# Patient Record
Sex: Female | Born: 1955 | Race: White | Hispanic: No | Marital: Single | Smoking: Former smoker
Health system: Southern US, Community
[De-identification: ages and names within clinical notes are randomized; demographics above are authoritative.]

## PROBLEM LIST (undated history)

## (undated) DIAGNOSIS — I639 Cerebral infarction, unspecified: Secondary | ICD-10-CM

## (undated) DIAGNOSIS — R51 Headache: Secondary | ICD-10-CM

## (undated) DIAGNOSIS — R0602 Shortness of breath: Secondary | ICD-10-CM

## (undated) DIAGNOSIS — R569 Unspecified convulsions: Secondary | ICD-10-CM

## (undated) DIAGNOSIS — C801 Malignant (primary) neoplasm, unspecified: Secondary | ICD-10-CM

## (undated) DIAGNOSIS — G039 Meningitis, unspecified: Secondary | ICD-10-CM

## (undated) HISTORY — PX: BREAST SURGERY: SHX581

## (undated) HISTORY — PX: CHOLECYSTECTOMY: SHX55

## (undated) HISTORY — PX: ABDOMINAL HYSTERECTOMY: SHX81

---

## 1998-01-01 ENCOUNTER — Ambulatory Visit (HOSPITAL_COMMUNITY): Admission: RE | Admit: 1998-01-01 | Discharge: 1998-01-01 | Payer: Self-pay | Admitting: Obstetrics and Gynecology

## 1998-10-01 ENCOUNTER — Other Ambulatory Visit: Admission: RE | Admit: 1998-10-01 | Discharge: 1998-10-01 | Payer: Self-pay | Admitting: Obstetrics and Gynecology

## 1998-11-05 ENCOUNTER — Inpatient Hospital Stay (HOSPITAL_COMMUNITY): Admission: RE | Admit: 1998-11-05 | Discharge: 1998-11-07 | Payer: Self-pay | Admitting: Obstetrics and Gynecology

## 1998-11-05 ENCOUNTER — Encounter (INDEPENDENT_AMBULATORY_CARE_PROVIDER_SITE_OTHER): Payer: Self-pay

## 1998-11-10 ENCOUNTER — Inpatient Hospital Stay (HOSPITAL_COMMUNITY): Admission: AD | Admit: 1998-11-10 | Discharge: 1998-11-10 | Payer: Self-pay | Admitting: Obstetrics and Gynecology

## 1999-03-21 ENCOUNTER — Encounter: Payer: Self-pay | Admitting: Oncology

## 1999-03-21 ENCOUNTER — Encounter: Admission: RE | Admit: 1999-03-21 | Discharge: 1999-03-21 | Payer: Self-pay | Admitting: Oncology

## 1999-04-04 ENCOUNTER — Ambulatory Visit (HOSPITAL_BASED_OUTPATIENT_CLINIC_OR_DEPARTMENT_OTHER): Admission: RE | Admit: 1999-04-04 | Discharge: 1999-04-04 | Payer: Self-pay | Admitting: General Surgery

## 1999-04-04 ENCOUNTER — Encounter: Payer: Self-pay | Admitting: General Surgery

## 1999-12-29 ENCOUNTER — Other Ambulatory Visit: Admission: RE | Admit: 1999-12-29 | Discharge: 1999-12-29 | Payer: Self-pay | Admitting: Obstetrics and Gynecology

## 2000-03-22 ENCOUNTER — Encounter: Admission: RE | Admit: 2000-03-22 | Discharge: 2000-03-22 | Payer: Self-pay | Admitting: General Surgery

## 2000-03-22 ENCOUNTER — Encounter: Payer: Self-pay | Admitting: General Surgery

## 2000-07-14 ENCOUNTER — Encounter: Payer: Self-pay | Admitting: Oncology

## 2000-07-14 ENCOUNTER — Encounter: Admission: RE | Admit: 2000-07-14 | Discharge: 2000-07-14 | Payer: Self-pay | Admitting: Oncology

## 2000-12-29 ENCOUNTER — Other Ambulatory Visit: Admission: RE | Admit: 2000-12-29 | Discharge: 2000-12-29 | Payer: Self-pay | Admitting: Obstetrics and Gynecology

## 2001-03-23 ENCOUNTER — Encounter: Admission: RE | Admit: 2001-03-23 | Discharge: 2001-03-23 | Payer: Self-pay | Admitting: General Surgery

## 2001-03-23 ENCOUNTER — Encounter: Payer: Self-pay | Admitting: General Surgery

## 2001-10-17 ENCOUNTER — Encounter: Admission: RE | Admit: 2001-10-17 | Discharge: 2001-10-17 | Payer: Self-pay | Admitting: Family Medicine

## 2001-10-17 ENCOUNTER — Encounter: Payer: Self-pay | Admitting: Family Medicine

## 2001-10-19 ENCOUNTER — Encounter: Payer: Self-pay | Admitting: Family Medicine

## 2001-10-19 ENCOUNTER — Ambulatory Visit (HOSPITAL_COMMUNITY): Admission: RE | Admit: 2001-10-19 | Discharge: 2001-10-19 | Payer: Self-pay | Admitting: Family Medicine

## 2001-10-20 ENCOUNTER — Encounter: Payer: Self-pay | Admitting: Family Medicine

## 2001-10-20 ENCOUNTER — Encounter: Admission: RE | Admit: 2001-10-20 | Discharge: 2001-10-20 | Payer: Self-pay | Admitting: Family Medicine

## 2001-11-09 ENCOUNTER — Ambulatory Visit (HOSPITAL_COMMUNITY): Admission: RE | Admit: 2001-11-09 | Discharge: 2001-11-10 | Payer: Self-pay | Admitting: *Deleted

## 2001-11-09 ENCOUNTER — Encounter (INDEPENDENT_AMBULATORY_CARE_PROVIDER_SITE_OTHER): Payer: Self-pay | Admitting: Specialist

## 2001-11-09 ENCOUNTER — Encounter: Payer: Self-pay | Admitting: *Deleted

## 2002-03-24 ENCOUNTER — Encounter: Payer: Self-pay | Admitting: General Surgery

## 2002-03-24 ENCOUNTER — Encounter: Admission: RE | Admit: 2002-03-24 | Discharge: 2002-03-24 | Payer: Self-pay | Admitting: General Surgery

## 2002-04-26 ENCOUNTER — Encounter: Payer: Self-pay | Admitting: Family Medicine

## 2002-04-26 ENCOUNTER — Encounter: Admission: RE | Admit: 2002-04-26 | Discharge: 2002-04-26 | Payer: Self-pay | Admitting: Family Medicine

## 2002-05-15 ENCOUNTER — Encounter: Admission: RE | Admit: 2002-05-15 | Discharge: 2002-06-15 | Payer: Self-pay | Admitting: Orthopedic Surgery

## 2003-04-19 ENCOUNTER — Encounter: Admission: RE | Admit: 2003-04-19 | Discharge: 2003-04-19 | Payer: Self-pay | Admitting: General Surgery

## 2004-12-04 ENCOUNTER — Ambulatory Visit (HOSPITAL_COMMUNITY): Admission: RE | Admit: 2004-12-04 | Discharge: 2004-12-04 | Payer: Self-pay | Admitting: Surgery

## 2004-12-04 ENCOUNTER — Encounter (INDEPENDENT_AMBULATORY_CARE_PROVIDER_SITE_OTHER): Payer: Self-pay | Admitting: Specialist

## 2006-04-15 ENCOUNTER — Encounter: Admission: RE | Admit: 2006-04-15 | Discharge: 2006-04-15 | Payer: Self-pay | Admitting: Family Medicine

## 2007-11-24 ENCOUNTER — Encounter: Admission: RE | Admit: 2007-11-24 | Discharge: 2007-11-24 | Payer: Self-pay | Admitting: Family Medicine

## 2008-11-26 ENCOUNTER — Encounter: Admission: RE | Admit: 2008-11-26 | Discharge: 2008-11-26 | Payer: Self-pay | Admitting: Family Medicine

## 2009-06-03 ENCOUNTER — Encounter: Admission: RE | Admit: 2009-06-03 | Discharge: 2009-06-03 | Payer: Self-pay | Admitting: Family Medicine

## 2009-11-27 ENCOUNTER — Encounter: Admission: RE | Admit: 2009-11-27 | Discharge: 2009-11-27 | Payer: Self-pay | Admitting: Family Medicine

## 2010-08-22 NOTE — Op Note (Signed)
[facility]  Patient:    Sheila Patterson                      MRN: [id number] Proc. Date: 04/04/99 Adm. Date:  [id number] Attending:  Janalyn Rouse                           Operative Report  PREOPERATIVE DIAGNOSIS:  Abnormal left breast mammogram with microcalcifications.  POSTOPERATIVE DIAGNOSIS:  Abnormal left breast mammogram with microcalcifications.  OPERATION:  Left breast biopsy with needle localization and specimen mammography.  SURGEON:  Rose Phi. Maple Hudson, M.D.  ANESTHESIA:  MAC.  OPERATIVE PROCEDURE:  The patient was placed on the operating table, and the left breast was prepped and draped in the usual fashion.  A curvilinear incision that was actually circumareolar in nature, was made around the previously placed wire centered at about the 12 oclock position of the left breast.  The area was then  thoroughly infiltrated with 1% Xylocaine with adrenaline.  An incision was made  with sharp dissection, and this area was excised.  Hemostasis was obtained with  electrocautery.  Specimen mammography confirmed the removal of the microcalcifications.  A subcuticular closure of 4-0 Monocryl and Steri-Strips was carried out. Dressing applied.  The patient was transferred to the recovery room in satisfactory condition having tolerated the procedure well. DD:  04/04/99 TD:  04/05/99 Job: [id number]

## 2010-08-22 NOTE — Op Note (Signed)
NAME:  Sheila Patterson, Sheila Patterson              ACCOUNT NO.:  [id number]   MEDICAL RECORD NO.:  [id number]          PATIENT TYPE:  AMB   LOCATION:  SDS                          FACILITY:  [facility]   PHYSICIAN:  Thomas A. 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   SURGEON:  Maisie Fus A. Cornett, M.D.   ANESTHESIA:  MAC with 15 cc of 0.5% Sensorcaine local.   ESTIMATED BLOOD LOSS:  5 cc.   SPECIMENS:  Left arm skin lesion to pathology.   INDICATIONS FOR PROCEDURE:  The patient is a 55 year old female who had a  pigmented lesion biopsied from her left forearm.  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  This could not be delineated from her biopsy.  I recommended a wide local  excision as well as a sentinel lymph node mapping in this setting, since the  pathology was somewhat vague, and the lesion was fairly small.  After  discussion with the patient, she just wanted to have this excised initially  and if a sentinel lymph node biopsy needed to be done, it could be done at a  later time.  I explained that this was an option, but that after initial  biopsy, can be a little more difficult to do sentinel lymph node mapping,  but she was fine with this after explaining it to her.   DESCRIPTION OF PROCEDURE:  Patient was brought to the operating suite and  placed supine.  After MAC anesthesia, left forearm was prepped and draped in  a sterile fashion.  The lesion looked like it measured roughly 0.6 x 0.5 cm  from her previous biopsy site.  I marked a 1 cm margin around this using a  ruler from the edge of the lesion circumferentially.  I then  excised the  outline of this with a scalpel.  Cautery was used to dissect all the way  down to the fascia of the flexor muscles of her left forearm.  Hemostasis  was excellent after removal of the lesion.  I closed the lesion in two  layers with a 3-0 Vicryl and subsequent 4-0 Monocryl subcuticular stitch.  Steri-Strips and dry dressings were applied.  All final counts of sponge,  needle, and instruments were counted and found to be correct in this portion  of the case.  The patient was awakened and taken to the recovery room in  satisfactory condition.      Thomas A. Cornett, M.D.  Electronically Signed     TAC/MEDQ  D:  12/04/2004  T:  12/04/2004  Job:  [id number]

## 2010-08-22 NOTE — Op Note (Signed)
NAME:  Sheila Patterson, Sheila Patterson                        ACCOUNT NO.:  [id number]   MEDICAL RECORD NO.:  [id number]                   PATIENT TYPE:  OIB   LOCATION:  5742                                 FACILITY:  [facility]   PHYSICIAN:  Maisie Fus B. Samuella Cota, M.D.               DATE OF BIRTH:  1955-07-07   DATE OF PROCEDURE:  11/09/2001  DATE OF DISCHARGE:  11/10/2001                                 OPERATIVE REPORT   CCS#:  [id number]   PREOPERATIVE DIAGNOSIS:  Chronic cholecystitis with cholelithiasis.   POSTOPERATIVE DIAGNOSIS:  Chronic cholecystitis with cholelithiasis.   OPERATION/PROCEDURE:  Laparoscopic cholecystectomy with operative  cholangiogram.   SURGEON:  Dr. Rozetta Nunnery.   ASSISTANT:  Gabrielle Dare. Janee Morn, MD   ANESTHESIA:  General.   ANESTHESIOLOGIST:  Kaylyn Layer. Michelle Piper, M.D. and CRNA.   DESCRIPTION OF PROCEDURE:  The patient was taken to the operating room,  placed on the table in supine position and after satisfactory general  anesthetic with intubation, the entire abdomen was prepped and draped in a  sterile field. The patient had had a previous TRAM operation with  reconstruction of the right breast.  A small vertical infraumbilical  incision was made through the skin and subcutaneous tissue and with gentle  dissection, the peritoneal cavity was entered. No mesh was ever encountered.  Pursestring suture of #0 Vicryl was placed and the Hasson trocar placed into  the abdomen and the abdomen insufflated to 14 mmHg pressure. A second 10 mm  trocar was placed just to the right of midline in the subxiphoid area. Two 5  mm trocars were placed quite laterally to avoid the rectus muscle. The  gallbladder was placed on traction and there were some adhesions to the  gallbladder which extended about halfway up the gallbladder. The adhesions  were taken down with gentle dissection. A small anterior cystic artery was  identified, doubly clipped and divided. The cystic duct was then identified.  The posterior cystic artery was identified which was large. This was triply  clipped on the remaining side, once on the gallbladder side and divided. An  Endoclip was placed on the gallbladder side of the cystic duct. A Cook  cholangiocath was then inserted through a separate stab wound in the right  upper quadrant of the abdomen. A small opening was made into cystic duct and  the cholangiocath was placed into the cystic duct and held with an Endoclip.  Using real-time C-arm fluoroscopy, cholangiogram was carried out. This  showed good filling in the entire biliary system with spillage of contrast  into the duodenum. There was no evidence of any filling defects. The cystic  duct was quite long. Following the cholangiogram, the cholangiocath was  removed and the cystic duct was dissected somewhat further so that three  clips could easily be placed across the remaining side and then the cystic  duct was divided. The gallbladder was dissected from  the bed using the  cautery. Another vessel high in the bed was doubly clipped and divided. The  gallbladder was finally dissected free, hemostasis was obtained. The  gallbladder was easily removed through the infraumbilical incision. She had  several very small stones in the gallbladder. A pursestring suture at the  infraumbilical incision was tied to give good closure. The right upper  quadrant was suctioned and irrigated with a full liter of fluid. There was  no evidence of any bleeding or bile leak. The two lateral trocars were  removed under direct vision. The two midline incisions were closed with  running subcuticular 4-0 Vicryl and the two lateral trocar  sites were closed with single simple inverted 4-0 Vicryl. Benzoin and 1/2  inch Steri-Strips were used to reinforce the skin closures. Dry sterile  dressings were applied. The patient seemed to tolerate the procedure well  and was taken to the PACU in satisfactory condition.                                                    Thomas B. Samuella Cota, M.D.    TBP/MEDQ  D:  11/09/2001  T:  11/13/2001  Job:  [id number]   cc:   Desma Maxim, M.D.

## 2010-10-20 ENCOUNTER — Other Ambulatory Visit: Payer: Self-pay | Admitting: Family Medicine

## 2010-10-20 DIAGNOSIS — Z1231 Encounter for screening mammogram for malignant neoplasm of breast: Secondary | ICD-10-CM

## 2010-12-02 ENCOUNTER — Ambulatory Visit
Admission: RE | Admit: 2010-12-02 | Discharge: 2010-12-02 | Disposition: A | Discharge status: Home / Self Care | Payer: Medicare Other | Source: Ambulatory Visit | Attending: Family Medicine | Admitting: Family Medicine

## 2010-12-02 DIAGNOSIS — Z1231 Encounter for screening mammogram for malignant neoplasm of breast: Secondary | ICD-10-CM

## 2011-06-01 DIAGNOSIS — L299 Pruritus, unspecified: Secondary | ICD-10-CM | POA: Diagnosis not present

## 2011-06-24 ENCOUNTER — Other Ambulatory Visit: Payer: Self-pay | Admitting: Family Medicine

## 2011-06-24 DIAGNOSIS — Z1231 Encounter for screening mammogram for malignant neoplasm of breast: Secondary | ICD-10-CM

## 2011-07-21 DIAGNOSIS — L0292 Furuncle, unspecified: Secondary | ICD-10-CM | POA: Diagnosis not present

## 2011-07-21 DIAGNOSIS — L0293 Carbuncle, unspecified: Secondary | ICD-10-CM | POA: Diagnosis not present

## 2011-07-21 DIAGNOSIS — L578 Other skin changes due to chronic exposure to nonionizing radiation: Secondary | ICD-10-CM | POA: Diagnosis not present

## 2011-08-07 DIAGNOSIS — R05 Cough: Secondary | ICD-10-CM | POA: Diagnosis not present

## 2011-08-25 ENCOUNTER — Encounter (HOSPITAL_COMMUNITY): Payer: Self-pay | Admitting: *Deleted

## 2011-08-25 ENCOUNTER — Inpatient Hospital Stay (HOSPITAL_COMMUNITY)
Admission: EM | Admit: 2011-08-25 | Discharge: 2011-09-03 | DRG: 066 | Disposition: A | Discharge status: Home Health | Payer: Medicare Other | Source: Other Acute Inpatient Hospital | Attending: Neurosurgery | Admitting: Neurosurgery

## 2011-08-25 ENCOUNTER — Inpatient Hospital Stay (HOSPITAL_COMMUNITY): Payer: Medicare Other

## 2011-08-25 DIAGNOSIS — Z8673 Personal history of transient ischemic attack (TIA), and cerebral infarction without residual deficits: Secondary | ICD-10-CM

## 2011-08-25 DIAGNOSIS — R51 Headache: Secondary | ICD-10-CM | POA: Diagnosis not present

## 2011-08-25 DIAGNOSIS — S066X9A Traumatic subarachnoid hemorrhage with loss of consciousness of unspecified duration, initial encounter: Secondary | ICD-10-CM | POA: Diagnosis not present

## 2011-08-25 DIAGNOSIS — Z87891 Personal history of nicotine dependence: Secondary | ICD-10-CM

## 2011-08-25 DIAGNOSIS — I69998 Other sequelae following unspecified cerebrovascular disease: Secondary | ICD-10-CM | POA: Diagnosis not present

## 2011-08-25 DIAGNOSIS — Z853 Personal history of malignant neoplasm of breast: Secondary | ICD-10-CM | POA: Diagnosis not present

## 2011-08-25 DIAGNOSIS — I609 Nontraumatic subarachnoid hemorrhage, unspecified: Principal | ICD-10-CM | POA: Diagnosis present

## 2011-08-25 DIAGNOSIS — I6529 Occlusion and stenosis of unspecified carotid artery: Secondary | ICD-10-CM | POA: Diagnosis not present

## 2011-08-25 DIAGNOSIS — G911 Obstructive hydrocephalus: Secondary | ICD-10-CM | POA: Diagnosis not present

## 2011-08-25 HISTORY — DX: Meningitis, unspecified: G03.9

## 2011-08-25 HISTORY — DX: Headache: R51

## 2011-08-25 HISTORY — DX: Unspecified convulsions: R56.9

## 2011-08-25 HISTORY — DX: Shortness of breath: R06.02

## 2011-08-25 HISTORY — DX: Malignant (primary) neoplasm, unspecified: C80.1

## 2011-08-25 HISTORY — DX: Cerebral infarction, unspecified: I63.9

## 2011-08-25 LAB — CBC
HCT: 45 % (ref 36.0–46.0)
MCHC: 33.1 g/dL (ref 30.0–36.0)
MCV: 88.9 fL (ref 78.0–100.0)
Platelets: 293 10*3/uL (ref 150–400)
RDW: 13.1 % (ref 11.5–15.5)
WBC: 9.6 10*3/uL (ref 4.0–10.5)

## 2011-08-25 LAB — URINALYSIS, ROUTINE W REFLEX MICROSCOPIC
Ketones, ur: 40 mg/dL — AB
Leukocytes, UA: NEGATIVE
Nitrite: NEGATIVE
Protein, ur: 100 mg/dL — AB
Urobilinogen, UA: 0.2 mg/dL (ref 0.0–1.0)

## 2011-08-25 LAB — PROTIME-INR
INR: 0.97 (ref 0.00–1.49)
Prothrombin Time: 13.1 seconds (ref 11.6–15.2)

## 2011-08-25 LAB — MRSA PCR SCREENING: MRSA by PCR: NEGATIVE

## 2011-08-25 LAB — COMPREHENSIVE METABOLIC PANEL
AST: 28 U/L (ref 0–37)
Albumin: 4.1 g/dL (ref 3.5–5.2)
BUN: 12 mg/dL (ref 6–23)
Chloride: 99 mEq/L (ref 96–112)
Creatinine, Ser: 0.44 mg/dL — ABNORMAL LOW (ref 0.50–1.10)
Potassium: 4.3 mEq/L (ref 3.5–5.1)
Total Bilirubin: 1 mg/dL (ref 0.3–1.2)
Total Protein: 7.6 g/dL (ref 6.0–8.3)

## 2011-08-25 LAB — URINE MICROSCOPIC-ADD ON

## 2011-08-25 MED ORDER — MORPHINE SULFATE 2 MG/ML IJ SOLN
1.0000 mg | INTRAMUSCULAR | Status: DC | PRN
  Administered 2011-08-25: 4 mg via INTRAVENOUS
  Administered 2011-08-25: 2 mg via INTRAVENOUS
  Administered 2011-08-26: 4 mg via INTRAVENOUS
  Filled 2011-08-25 (×2): qty 2
  Filled 2011-08-25: qty 1

## 2011-08-25 MED ORDER — SENNOSIDES-DOCUSATE SODIUM 8.6-50 MG PO TABS
1.0000 | ORAL_TABLET | Freq: Two times a day (BID) | ORAL | Status: DC
  Administered 2011-08-25 – 2011-09-02 (×13): 1 via ORAL
  Filled 2011-08-25 (×15): qty 1

## 2011-08-25 MED ORDER — ACETAMINOPHEN 325 MG PO TABS
650.0000 mg | ORAL_TABLET | ORAL | Status: DC | PRN
  Administered 2011-08-27 – 2011-09-03 (×6): 650 mg via ORAL
  Filled 2011-08-25 (×5): qty 2

## 2011-08-25 MED ORDER — SODIUM CHLORIDE 0.9 % IV SOLN
INTRAVENOUS | Status: DC
  Administered 2011-08-26 – 2011-08-30 (×6): via INTRAVENOUS

## 2011-08-25 MED ORDER — NIMODIPINE 30 MG/ML ORAL SOLUTION
60.0000 mg | ORAL | Status: DC
  Administered 2011-08-25 – 2011-08-27 (×5): 60 mg
  Filled 2011-08-25 (×53): qty 2

## 2011-08-25 MED ORDER — NIMODIPINE 30 MG PO CAPS
60.0000 mg | ORAL_CAPSULE | ORAL | Status: DC
  Administered 2011-08-25 – 2011-09-03 (×47): 60 mg via ORAL
  Filled 2011-08-25 (×61): qty 2

## 2011-08-25 MED ORDER — ACETAMINOPHEN 650 MG RE SUPP: 650.0000 mg | RECTAL | Status: DC | PRN

## 2011-08-25 MED ORDER — LORAZEPAM 2 MG/ML IJ SOLN
INTRAMUSCULAR | Status: AC
  Filled 2011-08-25: qty 1

## 2011-08-25 MED ORDER — LORAZEPAM 2 MG/ML IJ SOLN
1.0000 mg | Freq: Once | INTRAMUSCULAR | Status: AC
  Administered 2011-08-25: 1 mg via INTRAVENOUS

## 2011-08-25 MED ORDER — ACETAMINOPHEN-CODEINE #3 300-30 MG PO TABS
1.0000 | ORAL_TABLET | ORAL | Status: DC | PRN
  Administered 2011-08-26 – 2011-08-27 (×5): 2 via ORAL
  Administered 2011-08-27: 1 via ORAL
  Administered 2011-08-27 – 2011-08-28 (×6): 2 via ORAL
  Administered 2011-08-29: 1 via ORAL
  Administered 2011-08-29: 2 via ORAL
  Administered 2011-08-29: 1 via ORAL
  Filled 2011-08-25: qty 1
  Filled 2011-08-25 (×5): qty 2
  Filled 2011-08-25: qty 1
  Filled 2011-08-25 (×3): qty 2
  Filled 2011-08-25: qty 1
  Filled 2011-08-25 (×6): qty 2

## 2011-08-25 MED ORDER — PANTOPRAZOLE SODIUM 40 MG IV SOLR
40.0000 mg | Freq: Every day | INTRAVENOUS | Status: DC
  Administered 2011-08-25 – 2011-08-26 (×2): 40 mg via INTRAVENOUS
  Filled 2011-08-25 (×3): qty 40

## 2011-08-25 MED ORDER — ONDANSETRON HCL 4 MG/2ML IJ SOLN
4.0000 mg | Freq: Four times a day (QID) | INTRAMUSCULAR | Status: DC | PRN
  Administered 2011-08-26: 4 mg via INTRAVENOUS
  Filled 2011-08-25: qty 2

## 2011-08-25 MED ORDER — IOHEXOL 350 MG/ML SOLN
50.0000 mL | Freq: Once | INTRAVENOUS | Status: AC | PRN
  Administered 2011-08-25: 50 mL via INTRAVENOUS

## 2011-08-25 MED ORDER — LABETALOL HCL 5 MG/ML IV SOLN: 10.0000 mg | INTRAVENOUS | Status: DC | PRN
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
## 2011-08-26 NOTE — Progress Notes (Signed)
PT/OT Cancellation Note  Treatment cancelled today due to medical issues with patient which prohibited therapy.  Pt currently on bedrest.  Please update activity orders when appropriate.    Sheila Patterson 08/26/2011, 1:13 PM Jake Shark, PT DPT [phone]

## 2011-08-26 NOTE — Progress Notes (Signed)
Chaplain Note:  Chaplain visited pt and family.  Pt was in bed, asleep, and did not awaken during this visit.  Pt's mother and aunt were at bedside.  Chaplain's family discussed their concern over pt's condition and requested prayer.  Chaplain provided spiritual comfort, support, and prayer for pt and family.  Family expressed appreciation for chaplain support.  Chaplain will follow up as needed.   08/26/11 1300  Clinical Encounter Type  Visited With Patient and family together  Visit Type Initial;Spiritual support  Referral From Other (Comment) (Shandi Godfrey-referral)  Spiritual Encounters  Spiritual Needs Emotional;Prayer  Stress Factors  Patient Stress Factors Health changes;Lack of knowledge;Loss of control  Family Stress Factors Loss of control;Lack of knowledge    Verdie Shire, chaplain resident [phone]

## 2011-08-26 NOTE — Progress Notes (Signed)
08/26/2011 Cipriano Mile OTR/L Pager [phone] Office [phone]

## 2011-08-26 NOTE — Progress Notes (Signed)
Patient ID: Sheila Patterson, female   DOB: [birth year], 56 y.o.   MRN: [id number] BP 101/44  Pulse 60  Temp(Src) 99.3 F (37.4 C) (Oral)  Resp 22  Ht 5\' 5"  (1.651 m)  Wt 67.2 kg (148 lb 2.4 oz)  BMI 24.65 kg/m2  SpO2 94% Alert and oriented x4, speech clear and fluent. Following commands Perrl, full eom Symmetric facies.  Doing much better today. Continue hydration,watch in unit.
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
## 2011-08-27 MED ORDER — OXYCODONE HCL 5 MG PO TABS
5.0000 mg | ORAL_TABLET | ORAL | Status: DC | PRN
  Administered 2011-08-28 (×4): 5 mg via ORAL
  Administered 2011-08-29: 10 mg via ORAL
  Administered 2011-08-29 – 2011-08-30 (×4): 5 mg via ORAL
  Administered 2011-08-30 (×4): 10 mg via ORAL
  Administered 2011-08-31 (×2): 5 mg via ORAL
  Administered 2011-08-31 – 2011-09-01 (×3): 10 mg via ORAL
  Administered 2011-09-01: 5 mg via ORAL
  Filled 2011-08-27 (×3): qty 1
  Filled 2011-08-27 (×3): qty 2
  Filled 2011-08-27 (×5): qty 1
  Filled 2011-08-27 (×5): qty 2
  Filled 2011-08-27 (×3): qty 1

## 2011-08-27 MED ORDER — PANTOPRAZOLE SODIUM 40 MG PO TBEC
40.0000 mg | DELAYED_RELEASE_TABLET | Freq: Every day | ORAL | Status: DC
  Administered 2011-08-27 – 2011-09-03 (×8): 40 mg via ORAL
  Filled 2011-08-27 (×7): qty 1

## 2011-08-27 NOTE — Progress Notes (Signed)
Patient is receiving Protonix by the IV route.  Pt meets the P & T approved criteria for changing to oral administration.  - No GI bleeding  - Tolerating an oral or per tube diet  - Taking other oral or per tube medications.  Will change patient to Protonix 40mg PO daily per P&T policy.  Thank you. Briella Hobday, Pharm.D., BCPS Clinical Pharmacist Pager [phone]    

## 2011-08-27 NOTE — Progress Notes (Signed)
PT/OT Cancellation Note  Treatment cancelled today due to patient's refusal to participate.   Pt very fatigue and have difficulty keeping eyes open.  Pt continues to c/o headache and wishes defer PT/OT evaluation until tomorrow.  Jerzy Roepke 08/27/2011, 3:58 PM Jake Shark, PT DPT [phone]

## 2011-08-27 NOTE — Progress Notes (Signed)
PT/OT Cancellation Note  Treatment cancelled today due to patient's refusal to participate. Pt very fatigue and have difficulty keeping eyes open. Pt continues to c/o headache and wishes defer PT/OT evaluation until tomorrow.  08/27/2011 Cipriano Mile OTR/L Pager [phone] Office [phone]

## 2011-08-27 NOTE — Progress Notes (Signed)
Patient ID: Sheila Patterson, female   DOB: 1955-12-08, 56 y.o.   MRN: [id number] BP 143/62  Pulse 53  Temp(Src) 98.8 F (37.1 C) (Oral)  Resp 17  Ht 5\' 5"  (1.651 m)  Wt 67.1 kg (147 lb 14.9 oz)  BMI 24.62 kg/m2  SpO2 96% Alert and oriented x 4. Speech, clear and fluent  Perrl, full eom Symmetric facies, symmetric facial sensation Tongue and uvula midline Moving all extremities. Looks better, cognition improved. Doing quite well.

## 2011-08-27 NOTE — Progress Notes (Signed)
Chaplain Note:  Chaplain had follow up visit with pt and family.  Pt was lying in bed, awake.  Pt's mother was seated at bedside.  Pt expressed head pain and needed to sleep. Chaplain provided spiritual comfort, support, and prayer for pt and pt's mother.  Both expressed appreciation for chaplain support.  08/27/11 1500  Clinical Encounter Type  Visited With Patient and family together  Visit Type Follow-up;Spiritual support  Referral From Other (Comment) (Naavya Postma referral)  Spiritual Encounters  Spiritual Needs Emotional;Prayer  Stress Factors  Patient Stress Factors Health changes  Family Stress Factors Loss of control   Verdie Shire, chaplain resident [phone]

## 2011-08-28 NOTE — Progress Notes (Signed)
Patient ID: Sheila Patterson, female   DOB: 1955/05/15, 56 y.o.   MRN: [id number] BP 103/60  Pulse 89  Temp(Src) 98.9 F (37.2 C) (Oral)  Resp 16  Ht 5\' 5"  (1.651 m)  Wt 67.1 kg (147 lb 14.9 oz)  BMI 24.62 kg/m2  SpO2 96% Alert and oriented x 4. Speech clear, fluent. Perrl, full eom Symmetric facies. Tongue and uvula midline Moving all extremities well. No drift May be able to move to the floor .

## 2011-08-28 NOTE — Progress Notes (Signed)
08/28/2011 Cipriano Mile OTR/L Pager [phone] Office [phone]

## 2011-08-28 NOTE — Progress Notes (Signed)
PT/OT Cancellation Note  Treatment cancelled today due to patient's refusal to participate. Spoke with pt regarding importance of getting up, although pt states that she does not feel well. Will attempt evaluation this afternoon  08/28/2011 Milana Kidney DPT PAGER: [phone] OFFICE: [phone]    Milana Kidney 08/28/2011, 9:10 AM

## 2011-08-28 NOTE — Evaluation (Signed)
Physical Therapy Evaluation Patient Details Name: Sheila Patterson MRN: [id number] DOB: 1955/12/12 Today's Date: 08/28/2011 Time: 7829-5621 PT Time Calculation (min): 18 min  PT Assessment / Plan / Recommendation Clinical Impression  Pt presents with a medical diagnosis of SAH. Difficult to assess pt's cognitive level to determine if pt is at baseline. Pt has decreased safety awareness and impulsivity requiring supervision to min assist for all mobility. Pt will benefit from skilled PT in the acute care setting in order to maximize functional mobility and safety prior to d/c    PT Assessment  Patient needs continued PT services    Follow Up Recommendations  Other (comment) (TBD)    Barriers to Discharge Decreased caregiver support      lEquipment Recommendations  Other (comment) (TBD)    Recommendations for Other Services     Frequency Min 3X/week    Precautions / Restrictions Precautions Precautions: Fall Restrictions Weight Bearing Restrictions: No         Mobility  Bed Mobility Bed Mobility: Sit to Supine Sit to Supine: 5: Supervision Details for Bed Mobility Assistance: VC for proper sequencing Transfers Transfers: Stand to Sit;Sit to Stand Sit to Stand: 4: Min assist;With upper extremity assist;From chair/3-in-1 Stand to Sit: 4: Min assist;With upper extremity assist;To bed Details for Transfer Assistance: VC for hand placement and safety due to pt with impulsivity Ambulation/Gait Ambulation/Gait Assistance: 4: Min assist Ambulation Distance (Feet): 30 Feet (distance limited by fatigue and malaise) Assistive device: 1 person hand held assist Ambulation/Gait Assistance Details: VC for safety throughout ambulation. Pt preferred to ambulate holding onto IV pole rather than therapist's hand. Pt impulsive during gait Gait Pattern: Step-to pattern;Narrow base of support Gait velocity: decreased gait speed    Exercises     PT Diagnosis: Difficulty walking;Acute pain   PT Problem List: Decreased balance;Decreased activity tolerance;Decreased mobility;Decreased knowledge of use of DME;Decreased safety awareness PT Treatment Interventions: DME instruction;Gait training;Functional mobility training;Stair training;Therapeutic activities;Therapeutic exercise;Balance training;Neuromuscular re-education;Patient/family education   PT Goals Acute Rehab PT Goals PT Goal Formulation: With patient Time For Goal Achievement: 09/11/11 Potential to Achieve Goals: Good Pt will go Supine/Side to Sit: with modified independence PT Goal: Supine/Side to Sit - Progress: Goal set today Pt will go Sit to Supine/Side: with modified independence PT Goal: Sit to Supine/Side - Progress: Goal set today Pt will go Sit to Stand: with modified independence PT Goal: Sit to Stand - Progress: Goal set today Pt will go Stand to Sit: with modified independence PT Goal: Stand to Sit - Progress: Goal set today Pt will Transfer Bed to Chair/Chair to Bed: with modified independence PT Transfer Goal: Bed to Chair/Chair to Bed - Progress: Goal set today Pt will Ambulate: >150 feet;with modified independence;with least restrictive assistive device PT Goal: Ambulate - Progress: Goal set today Pt will Go Up / Down Stairs: Flight;with supervision;with rail(s) PT Goal: Up/Down Stairs - Progress: Goal set today  Visit Information  Last PT Received On: 08/28/11 PT/OT Co-Evaluation/Treatment: Yes    Subjective Data      Prior Functioning  Home Living Lives With: Alone Available Help at Discharge: Family;Available 24 hours/day Type of Home: House Home Access: Stairs to enter [employer] of Steps: 12 Entrance Stairs-Rails: Right Home Layout: One level Bathroom Shower/Tub: Forensic scientist: Standard Bathroom Accessibility: Yes How Accessible: Accessible via walker Home Adaptive Equipment: None Prior Function Level of Independence: Independent Able to  Take Stairs?: Yes Driving: Yes Vocation: On disability Communication Communication: No difficulties Dominant Hand: Right  Cognition  Overall Cognitive Status: Impaired Area of Impairment: Safety/judgement;Attention Orientation Level: Appears intact for tasks assessed Behavior During Session: Restless Safety/Judgement: Decreased safety judgement for tasks assessed;Impulsive;Decreased awareness of need for assistance    Extremity/Trunk Assessment Right Lower Extremity Assessment RLE ROM/Strength/Tone: Within functional levels Left Lower Extremity Assessment LLE ROM/Strength/Tone: Within functional levels   Balance Balance Balance Assessed: Yes Static Standing Balance Static Standing - Balance Support: Bilateral upper extremity supported;During functional activity Static Standing - Level of Assistance: 5: Stand by assistance Static Standing - Comment/# of Minutes: pt stood at sink for ~10 minutes with stand by assist for safety  End of Session PT - End of Session Equipment Utilized During Treatment: Gait belt Activity Tolerance: Patient limited by fatigue Patient left: in bed;with call bell/phone within reach Nurse Communication: Mobility status   Milana Kidney 08/28/2011, 11:18 AM  08/28/2011 Milana Kidney DPT PAGER: [phone] OFFICE: [phone]

## 2011-08-28 NOTE — Plan of Care (Signed)
Problem: Phase II Progression Outcomes Goal: Tolerating diet/TF at goal rate Outcome: Completed/Met Date Met:  08/28/11 Pt on regular diet, tolerating well

## 2011-08-28 NOTE — Progress Notes (Signed)
Occupational Therapy Evaluation Patient Details Name: Sheila Patterson MRN: [id number] DOB: 07/03/1955 Today's Date: 08/28/2011 Time: 1478-2956 OT Time Calculation (min): 26 min  OT Assessment / Plan / Recommendation Clinical Impression  Pt admitted with [facility] and demonstrates with decreased safety awareness(question if this is baseline).  Will benefit from acute OT services in prep for safe d/c home.    OT Assessment  Patient needs continued OT Services    Follow Up Recommendations  Supervision/Assistance - 24 hour;Home health OT (HHOT vs no OT f/u pending progress)    Barriers to Discharge Decreased caregiver support [name] can assist but unsure how much physical assist she can provide.  Equipment Recommendations  Other (comment) (TBD)    Recommendations for Other Services    Frequency  Min 2X/week    Precautions / Restrictions Precautions Precautions: Fall Restrictions Weight Bearing Restrictions: No   Pertinent Vitals/Pain 5/10 head pain.    ADL  Grooming: Performed;Denture care;Wash/dry face;Set up Where Assessed - Grooming: Unsupported standing Upper Body Dressing: Performed;Set up Where Assessed - Upper Body Dressing: Unsupported sitting Lower Body Dressing: Performed;Set up Where Assessed - Lower Body Dressing: Unsupported sitting Toilet Transfer: Simulated;Minimal assistance Toilet Transfer Method:  (ambulating) Toilet Transfer Equipment:  (bed) Equipment Used: Gait belt Transfers/Ambulation Related to ADLs: Pt ambulated ~30 ft with min hand held assist for steadying and balance.  Pt fatigued quickly during ambulation.    OT Diagnosis: Generalized weakness;Cognitive deficits;Acute pain  OT Problem List: Decreased activity tolerance;Impaired balance (sitting and/or standing);Decreased safety awareness;Decreased knowledge of use of DME or AE;Pain OT Treatment Interventions: Self-care/ADL training;DME and/or AE instruction;Therapeutic  activities;Cognitive remediation/compensation;Patient/family education;Balance training   OT Goals Acute Rehab OT Goals OT Goal Formulation: With patient Time For Goal Achievement: 09/11/11 Potential to Achieve Goals: Good ADL Goals Pt Will Transfer to Toilet: Ambulation;Regular height toilet;with modified independence ADL Goal: Toilet Transfer - Progress: Goal set today Pt Will Perform Tub/Shower Transfer: Tub transfer;with modified independence;Ambulation ADL Goal: Tub/Shower Transfer - Progress: Goal set today Miscellaneous OT Goals Miscellaneous OT Goal #1: Pt will retrieve ADL items with mod I in prep for ADL tasks. OT Goal: Miscellaneous Goal #1 - Progress: Goal set today Miscellaneous OT Goal #2: Pt will independently demonstrate safe hand placement and good safety awareness during all functional transfers. OT Goal: Miscellaneous Goal #2 - Progress: Goal set today  Visit Information  Last OT Received On: 08/28/11    Subjective Data      Prior Functioning  Home Living Lives With: Alone Available Help at Discharge: Family;Available 24 hours/day Type of Home: House Home Access: Stairs to enter [employer] of Steps: 12 Entrance Stairs-Rails: Right Home Layout: One level Bathroom Shower/Tub: Forensic scientist: Standard Bathroom Accessibility: Yes How Accessible: Accessible via walker Home Adaptive Equipment: None Prior Function Level of Independence: Independent Able to Take Stairs?: Yes Driving: Yes Vocation: On disability Communication Communication: No difficulties Dominant Hand: Right    Cognition  Overall Cognitive Status: Impaired Area of Impairment: Safety/judgement;Attention Arousal/Alertness: Awake/alert Orientation Level: Appears intact for tasks assessed Behavior During Session: Restless Safety/Judgement: Decreased safety judgement for tasks assessed;Impulsive;Decreased awareness of need for assistance Cognition -  Other Comments: Pt required continuous verbal cueing to keep LUE still during BP reading.  Pt frequently attempting to stand when asked not to while therapist organized lines and leads.    Extremity/Trunk Assessment Right Upper Extremity Assessment RUE ROM/Strength/Tone: Within functional levels Left Upper Extremity Assessment LUE ROM/Strength/Tone: Within functional levels Right Lower Extremity Assessment RLE  ROM/Strength/Tone: Within functional levels Left Lower Extremity Assessment LLE ROM/Strength/Tone: Within functional levels   Mobility Bed Mobility Bed Mobility: Sit to Supine Sit to Supine: 5: Supervision Details for Bed Mobility Assistance: VC for proper sequencing Transfers Sit to Stand: 4: Min assist;With upper extremity assist;From chair/3-in-1 Stand to Sit: 4: Min assist;With upper extremity assist;To bed Details for Transfer Assistance: VC for hand placement and safety due to pt with impulsivity   Exercise    Balance Balance Balance Assessed: Yes Static Standing Balance Static Standing - Balance Support: Bilateral upper extremity supported;During functional activity Static Standing - Level of Assistance: 5: Stand by assistance Static Standing - Comment/# of Minutes: pt stood at sink for ~10 minutes with stand by assist for safety  End of Session OT - End of Session Equipment Utilized During Treatment: Gait belt Activity Tolerance: Patient limited by fatigue Patient left: in bed;with call bell/phone within reach Nurse Communication: Mobility status  08/28/2011 Cipriano Mile OTR/L Pager [phone] Office [phone]  Cipriano Mile 08/28/2011, 11:53 AM

## 2011-08-28 NOTE — Progress Notes (Signed)
Clinical Social Worker received referral for SNF; at this time, PT/OT is recommending HH.  CSW to sign off, please re consult if needed.   Angelia Mould, MSW, [facility] [phone]

## 2011-08-29 LAB — BASIC METABOLIC PANEL
BUN: 11 mg/dL (ref 6–23)
CO2: 23 mEq/L (ref 19–32)
Calcium: 8.7 mg/dL (ref 8.4–10.5)
Chloride: 98 mEq/L (ref 96–112)
Creatinine, Ser: 0.44 mg/dL — ABNORMAL LOW (ref 0.50–1.10)
Glucose, Bld: 116 mg/dL — ABNORMAL HIGH (ref 70–99)

## 2011-08-29 MED ORDER — POTASSIUM CHLORIDE CRYS ER 20 MEQ PO TBCR
40.0000 meq | EXTENDED_RELEASE_TABLET | ORAL | Status: AC
  Administered 2011-08-29 (×2): 40 meq via ORAL
  Filled 2011-08-29: qty 2

## 2011-08-29 MED ORDER — DEXAMETHASONE SODIUM PHOSPHATE 4 MG/ML IJ SOLN
4.0000 mg | Freq: Four times a day (QID) | INTRAMUSCULAR | Status: DC
  Administered 2011-08-29 (×3): 4 mg via INTRAVENOUS
  Filled 2011-08-29 (×3): qty 1

## 2011-08-29 MED ORDER — POTASSIUM CHLORIDE CRYS ER 20 MEQ PO TBCR
EXTENDED_RELEASE_TABLET | ORAL | Status: AC
  Administered 2011-08-29: 40 meq via ORAL
  Filled 2011-08-29: qty 2

## 2011-08-29 NOTE — Progress Notes (Signed)
Patient ID: Sheila Patterson, female   DOB: Oct 07, 1955, 56 y.o.   MRN: [id number] Patient complains of increased headache today. No nausea and vomiting. She is awake and alert and conversant. He moves all extremities. She follows commands. She does have some neck pain and some hip pain. We'll try some steroids. His may simply be from meningeal irritation. If she gets no better will probably repeat her head CT to rule out hydrocephalus or recurrent hemorrhage.

## 2011-08-30 ENCOUNTER — Inpatient Hospital Stay (HOSPITAL_COMMUNITY): Payer: Medicare Other

## 2011-08-30 NOTE — Progress Notes (Signed)
Patient ID: Sheila Patterson, female   DOB: May 27, 1955, 56 y.o.   MRN: [id number] Patient looks really good this morning. She doesn't have any significant headache at all. She is ambulatory conversant and moving all extremities equally. Her head CT looks fairly stable. There may be a very very slight increase in her ventricular size as compared to her admission scan. The subarachnoid hemorrhage is mostly resolved. No real change in the areas of encephalomalacia. No new infarct. I think it is safe to transfer her to the floor. She seems to be doing very well.

## 2011-08-31 NOTE — Progress Notes (Signed)
Subjective: Patient reports She's feeling better she still is of a headache but is improving  Objective: Vital signs in last 24 hours: Temp:  [97.9 F (36.6 C)-98.8 F (37.1 C)] 98.6 F (37 C) (05/27 0558) Pulse Rate:  [66-92] 71  (05/27 0558) Resp:  [15-19] 18  (05/27 0558) BP: (122-156)/(55-85) 156/85 mmHg (05/27 0558) SpO2:  [96 %-99 %] 96 % (05/27 0558)  Intake/Output from previous day: 05/26 0701 - 05/27 0700 In: 200 [I.V.:200] Out: 1000 [Urine:1000] Intake/Output this shift:    Strength out5of 5, ambulate well  Lab Results: No results found for this basename: WBC:2,HGB:2,HCT:2,PLT:2 in the last 72 hours BMET  Basename 08/29/11 0700  NA 134*  K 3.0*  CL 98  CO2 23  GLUCOSE 116*  BUN 11  CREATININE 0.44*  CALCIUM 8.7    Studies/Results: Ct Head Wo Contrast  08/30/2011  *RADIOLOGY REPORT*  Clinical Data: History of subarachnoid hemorrhage.  Increased headache.  CT HEAD WITHOUT CONTRAST  Technique:  Contiguous axial images were obtained from the base of the skull through the vertex without contrast.  Comparison: 08/25/2011  Findings: There is slight enlargement of the lateral ventricles. Again noted is a small amount of blood within the right lateral ventricle.  There may be a small amount of layering blood in the left parietal/occipital region near the area of encephalomalacia. There is another focal area of encephalomalacia in the left frontal lobe and a smaller area in the right frontal lobe.  There is a small amount of subarachnoid hemorrhage along the vertex of the right parietal lobe. There appears to be decreased blood in the basal cisterns. No acute bony abnormality.  IMPRESSION: Slight enlargement of the lateral ventricles.  Decreased subarachnoid blood in the basal cisterns but there are still scattered areas subarachnoid blood and a small amount of intraventricular blood.  These results were called by telephone on 08/30/2011  at  7:33 a.m. to  the patient's nurse,  Aundra Millet, who verbally acknowledged these results.  Original Report Authenticated By: Richarda Overlie, M.D.    Assessment/Plan: Patient is recovering well continue to work with physical therapy observe her for the next day or 2 possible discharge earlier this week  LOS: 6 days     Zekiah Coen P 08/31/2011, 8:28 AM

## 2011-08-31 NOTE — Progress Notes (Signed)
Occupational Therapy Treatment Patient Details Name: Sheila Patterson MRN: [id number] DOB: 05-26-1955 Today's Date: 08/31/2011 Time: 1000-1011 OT Time Calculation (min): 11 min  OT Assessment / Plan / Recommendation Comments on Treatment Session Treatment session limited due to pt's c/o headache pain and agitated behavior.  Pt's mother (caregiver) present during session and states that she lives next door and pt can stay with her at d/c if needed.  Discussed with pt and pt's mother concern regarding pt's demonstration of decreased activitiy tolerance.  Continue to recommend HHOT to address home safety.       Follow Up Recommendations  Supervision/Assistance - 24 hour;Home health OT    Barriers to Discharge       Equipment Recommendations   (TBD)    Recommendations for Other Services    Frequency Min 2X/week   Plan Discharge plan remains appropriate    Precautions / Restrictions Precautions Precautions: Fall Restrictions Weight Bearing Restrictions: No   Pertinent Vitals/Pain Pt with 5/10 headache pain.    ADL  Toilet Transfer: Simulated;Supervision/safety;Min guard Toilet Transfer Method:  (ambulating) Acupuncturist:  (bed) Equipment Used: Gait belt Transfers/Ambulation Related to ADLs: Pt ambulated ~15 ft with close supervision from bed to hallway while becoming increasingly agitated. Pt then turned around and ambulated back to bed with min guard for safety due to pt's impulsive behavior and agitation. Pt threw herself into the bed with close supervision for safety and immediately covered pillows and then threw pillows on the floor. ADL Comments: Limited participation in ADLs due to agitation.  Question if this is true decreased activity tolerance due to headache vs self-limiting behavior.    OT Diagnosis:    OT Problem List:   OT Treatment Interventions:     OT Goals ADL Goals Pt Will Transfer to Toilet: Ambulation;Regular height toilet;with modified  independence ADL Goal: Toilet Transfer - Progress: Progressing toward goals Miscellaneous OT Goals Miscellaneous OT Goal #2: Pt will independently demonstrate safe hand placement and good safety awareness during all functional transfers. OT Goal: Miscellaneous Goal #2 - Progress: Progressing toward goals  Visit Information  Last OT Received On: 08/31/11    Subjective Data      Prior Functioning       Cognition  Overall Cognitive Status: Impaired Area of Impairment: Safety/judgement;Attention Arousal/Alertness: Awake/alert Orientation Level: Appears intact for tasks assessed Behavior During Session: Agitated (restless) Safety/Judgement: Decreased safety judgement for tasks assessed;Impulsive;Decreased awareness of need for assistance    Mobility Bed Mobility Bed Mobility: Supine to Sit;Sit to Supine;Sitting - Scoot to Edge of Bed Supine to Sit: 5: Supervision Sitting - Scoot to Edge of Bed: 5: Supervision Sit to Supine: 5: Supervision Details for Bed Mobility Assistance: Supervision due to swift impulsive movements. Transfers Transfers: Sit to Stand;Stand to Sit Sit to Stand: 5: Supervision;From bed;With upper extremity assist Stand to Sit: 5: Supervision;To bed;With upper extremity assist Details for Transfer Assistance: VC for hand placement and safety due to pt with impulsivity   Exercises    Balance    End of Session OT - End of Session Equipment Utilized During Treatment: Gait belt Activity Tolerance: Patient limited by pain Patient left: in bed;with call bell/phone within reach;with family/visitor present Nurse Communication: Mobility status  08/31/2011 Cipriano Mile OTR/L Pager [phone] Office [phone]  Cipriano Mile 08/31/2011, 10:38 AM

## 2011-09-01 MED ORDER — DIPHENHYDRAMINE HCL 25 MG PO CAPS
25.0000 mg | ORAL_CAPSULE | ORAL | Status: DC | PRN
Start: 2011-09-01 — End: 2011-09-03
  Administered 2011-09-01 – 2011-09-02 (×3): 25 mg via ORAL
  Filled 2011-09-01 (×3): qty 1

## 2011-09-01 NOTE — Progress Notes (Signed)
Physical Therapy Treatment Patient Details Name: Sheila Patterson MRN: [id number] DOB: 1955-12-12 Today's Date: 09/01/2011 Time: 4098-1191 PT Time Calculation (min): 15 min  PT Assessment / Plan / Recommendation Comments on Treatment Session  pt presents with SAH.  pt movign better and with better cognition, however still requiring Supervision for many tasks.      Follow Up Recommendations  Outpatient PT    Barriers to Discharge        Equipment Recommendations  None recommended by PT    Recommendations for Other Services    Frequency Min 3X/week   Plan Frequency remains appropriate;Discharge plan needs to be updated    Precautions / Restrictions Precautions Precautions: Fall Restrictions Weight Bearing Restrictions: No   Pertinent Vitals/Pain Denies pain.      Mobility  Bed Mobility Bed Mobility: Supine to Sit;Sit to Supine;Sitting - Scoot to Edge of Bed Supine to Sit: 6: Modified independent (Device/Increase time) Sitting - Scoot to Edge of Bed: 6: Modified independent (Device/Increase time) Sit to Supine: 6: Modified independent (Device/Increase time) Details for Bed Mobility Assistance: Seems less impulsive than previous therapy sessions.   Transfers Transfers: Stand to Sit;Sit to Stand Sit to Stand: 5: Supervision;With upper extremity assist;From bed;From toilet Stand to Sit: 5: Supervision;With upper extremity assist;To bed;To toilet Details for Transfer Assistance: demos good use of UEs and grab bar in bathroom Ambulation/Gait Ambulation/Gait Assistance: 4: Min guard Ambulation Distance (Feet): 200 Feet Assistive device: None Ambulation/Gait Assistance Details: pt mildly unsteady with head turns and when there is increased distractions in the hallway.  pt seems unaware of unsteadiness.   Gait Pattern: Step-through pattern;Decreased stride length;Narrow base of support Stairs: Yes Stairs Assistance: 5: Supervision Stairs Assistance Details (indicate cue type  and reason): cues to slow down as pt seems slightly impulsive on steps, but no LOB Stair Management Technique: One rail Right;Forwards Number of Stairs: 5  Wheelchair Mobility Wheelchair Mobility: No    Exercises     PT Diagnosis:    PT Problem List:   PT Treatment Interventions:     PT Goals Acute Rehab PT Goals Time For Goal Achievement: 09/11/11 PT Goal: Supine/Side to Sit - Progress: Met PT Goal: Sit to Supine/Side - Progress: Met PT Goal: Sit to Stand - Progress: Progressing toward goal PT Goal: Stand to Sit - Progress: Progressing toward goal PT Goal: Ambulate - Progress: Progressing toward goal PT Goal: Up/Down Stairs - Progress: Progressing toward goal  Visit Information  Last PT Received On: 09/01/11 Assistance Needed: +1    Subjective Data  Subjective: My mom says I'm going to her house.     Cognition  Overall Cognitive Status: Impaired Area of Impairment: Safety/judgement;Attention Arousal/Alertness: Awake/alert Orientation Level: Appears intact for tasks assessed Behavior During Session: [facility] for tasks performed Current Attention Level: Selective Safety/Judgement: Decreased safety judgement for tasks assessed;Impulsive;Decreased awareness of need for assistance    Balance  Balance Balance Assessed: Yes Static Standing Balance Static Standing - Balance Support: No upper extremity supported Static Standing - Level of Assistance: 5: Stand by assistance Static Standing - Comment/# of Minutes: pt able to stand at sink for UE task with only Supervision.    End of Session PT - End of Session Equipment Utilized During Treatment: Gait belt Activity Tolerance: Patient tolerated treatment well Patient left: in bed;with call bell/phone within reach Nurse Communication: Mobility status    Sunny Schlein,  [phone] 09/01/2011, 10:26 AM

## 2011-09-01 NOTE — Progress Notes (Signed)
Patient ID: Sheila Patterson, female   DOB: Sep 13, 1955, 56 y.o.   MRN: [id number] BP 118/75  Pulse 76  Temp(Src) 98.7 F (37.1 C) (Oral)  Resp 20  Ht 5\' 5"  (1.651 m)  Wt 67.1 kg (147 lb 14.9 oz)  BMI 24.62 kg/m2  SpO2 99% Alert and oriented x 4. Speech clear and fluent Perrl, full eom Symmetric facies Tongue and uvula midline 5/5 strength Still complains of headache. Possible lp tomorrow if headache does not improve.

## 2011-09-01 NOTE — Progress Notes (Signed)
Occupational Therapy Treatment Patient Details Name: Sheila Patterson MRN: [id number] DOB: 11/22/1955 Today's Date: 09/01/2011 Time: 4098-1191 OT Time Calculation (min): 24 min  OT Assessment / Plan / Recommendation Comments on Treatment Session Treatment session limited by increasing HA during session. RN made aware. Pt returned to bed    Follow Up Recommendations  Supervision/Assistance - 24 hour;Home health OT    Barriers to Discharge       Equipment Recommendations  None recommended by OT    Recommendations for Other Services    Frequency     Plan Discharge plan remains appropriate    Precautions / Restrictions Precautions Precautions: Fall Restrictions Weight Bearing Restrictions: No   Pertinent Vitals/Pain Pt c/o HA 5/10 initially and progressed to 7/10. RN made aware    ADL  Grooming: Performed;Independent Where Assessed - Grooming: Unsupported standing Lower Body Dressing: Performed;Supervision/safety Where Assessed - Lower Body Dressing: Unsupported sit to stand Toilet Transfer: Performed;Supervision/safety Toilet Transfer Method: Sit to Barista: Regular height toilet;Grab bars Toileting - Clothing Manipulation and Hygiene: Simulated;Min guard Where Assessed - Engineer, mining and Hygiene: Standing Tub/Shower Transfer: Simulated;Min guard Tub/Shower Transfer Method: Science writer: Walk in shower Equipment Used: Gait belt Transfers/Ambulation Related to ADLs: Ambulated > 63ft for retrieval task with min guard A. Pt c/o of increasing HA with ambulation. Returned to room ADL Comments: Pt able to ambulate back to room with linens for chair in arms    OT Diagnosis:    OT Problem List:   OT Treatment Interventions:     OT Goals ADL Goals ADL Goal: Toilet Transfer - Progress: Progressing toward goals ADL Goal: Tub/Shower Transfer - Progress: Progressing toward goals Miscellaneous OT Goals OT  Goal: Miscellaneous Goal #1 - Progress: Progressing toward goals OT Goal: Miscellaneous Goal #2 - Progress: Progressing toward goals  Visit Information  Last OT Received On: 09/01/11 Assistance Needed: +1    Subjective Data      Prior Functioning       Cognition  Orientation Level: Appears intact for tasks assessed Behavior During Session:  (somewhat impulsive) Current Attention Level: Selective Safety/Judgement: Decreased safety judgement for tasks assessed;Impulsive;Decreased awareness of need for assistance    Mobility Bed Mobility Supine to Sit: 6: Modified independent (Device/Increase time) Sitting - Scoot to Edge of Bed: 6: Modified independent (Device/Increase time) Sit to Supine: 6: Modified independent (Device/Increase time) Transfers Sit to Stand: 5: Supervision;From bed;From toilet Stand to Sit: 5: Supervision;To bed;To toilet   Exercises    Balance    End of Session OT - End of Session Equipment Utilized During Treatment: Gait belt Activity Tolerance: Patient limited by pain Patient left: in bed;with call bell/phone within reach;with family/visitor present Nurse Communication: Mobility status   Brian Zeitlin 09/01/2011, 3:05 PM

## 2011-09-01 NOTE — Care Management Note (Signed)
    Page 1 of 2   09/04/2011     9:34:57 AM   CARE MANAGEMENT NOTE 09/04/2011  Patient:  Sheila Patterson,Sheila Patterson   Account Number:  [id number]  Date Initiated:  08/26/2011  Documentation initiated by:  [facility]  Subjective/Objective Assessment:   Admitted with [facility].Lives alone.     Action/Plan:   PT eval-  OT eval-   Anticipated DC Date:  08/29/2011   Anticipated DC Plan:  HOME W HOME HEALTH SERVICES      DC Planning Services  CM consult      Choice offered to / List presented to:  C-1 Patient        HH arranged  HH-3 OT  HH-2 PT      HH agency  [facility]   Status of service:  Completed, signed off Medicare Important Message given?   (If response is "NO", the following Medicare IM given date fields will be blank) Date Medicare IM given:   Date Additional Medicare IM given:    Discharge Disposition:  HOME W HOME HEALTH SERVICES  Per UR Regulation:  Reviewed for med. necessity/level of care/duration of stay  If discussed at Long Length of Stay Meetings, dates discussed:   09/02/2011    Comments:  PCP Dr. Kirstie Peri  09/03/11 Onnie Boer, RN, BSN [phone] PT WAS DC'D TO HOME WITH HH PT/OT WITH [facility]  09/03/11 Onnie Boer, RN, BSN 1116 HH PT HAS BEEN ADDED TO [facility].  PT IS READY TO GO AND IS AWAITING DR. CABBELL.  WILL F/U ON NEEDS  09/01/11 Onnie Boer, RN, BSN 1443 PT HAS CHOSEN [facility] FOR HH OT.  WILL F/U WITH PT AS THEY HAVE RECOMMENDED OP PT.

## 2011-09-03 NOTE — Discharge Instructions (Signed)
STROKE/TIA DISCHARGE INSTRUCTIONS SMOKING Cigarette smoking nearly doubles your risk of having a stroke & is the single most alterable risk factor  If you smoke or have smoked in the last 12 months, you are advised to quit smoking for your health.  Most of the excess cardiovascular risk related to smoking disappears within a year of stopping.  Ask you doctor about anti-smoking medications  [place] Quit Line: 1-800-QUIT NOW  Free Smoking Cessation Classes ([phone]  CHOLESTEROL Know your levels; limit fat & cholesterol in your diet  Lipid Panel  No results found for this basename: chol, trig, hdl, cholhdl, vldl, ldlcalc      Many patients benefit from treatment even if their cholesterol is at goal.  Goal: Total Cholesterol (CHOL) less than 160  Goal:  Triglycerides (TRIG) less than 150  Goal:  HDL greater than 40  Goal:  LDL (LDLCALC) less than 100   BLOOD PRESSURE American Stroke Association blood pressure target is less that 120/80 mm/Hg  Your discharge blood pressure is:  BP: 110/77 mmHg  Monitor your blood pressure  Limit your salt and alcohol intake  Many individuals will require more than one medication for high blood pressure  DIABETES (A1c is a blood sugar average for last 3 months) Goal HGBA1c is under 7% (HBGA1c is blood sugar average for last 3 months)  Diabetes: {STROKE DC DIABETES:22357}    No results found for this basename: HGBA1C     Your HGBA1c can be lowered with medications, healthy diet, and exercise.  Check your blood sugar as directed by your physician  Call your physician if you experience unexplained or low blood sugars.  PHYSICAL ACTIVITY/REHABILITATION Goal is 30 minutes at least 4 days per week    {STROKE DC ACTIVITY/REHAB:22359}  Activity decreases your risk of heart attack and stroke and makes your heart stronger.  It helps control your weight and blood pressure; helps you relax and can improve your mood.  Participate in a regular exercise  program.  Talk with your doctor about the best form of exercise for you (dancing, walking, swimming, cycling).  DIET/WEIGHT Goal is to maintain a healthy weight  Your discharge diet is: General *** liquids Your height is:  Height: 5\' 5"  (165.1 cm) Your current weight is: Weight: 67.2 kg (148 lb 2.4 oz) Your Body Mass Index (BMI) is:  BMI (Calculated): 24.7   Following the type of diet specifically designed for you will help prevent another stroke.  Your goal weight range is:  ***  Your goal Body Mass Index (BMI) is 19-24.  Healthy food habits can help reduce 3 risk factors for stroke:  High cholesterol, hypertension, and excess weight.  RESOURCES Stroke/Support Group:  Call [phone]  they meet the 3rd Sunday of the month on the Rehab Unit at [facility], [place] ( no meetings June, July & Aug).  STROKE EDUCATION PROVIDED/REVIEWED AND GIVEN TO PATIENT Stroke warning signs and symptoms How to activate emergency medical system (call 911). Medications prescribed at discharge. Need for follow-up after discharge. Personal risk factors for stroke. Pneumonia vaccine given:   {STROKE DC YES/NO/DATE:22363} Flu vaccine given:   {STROKE DC YES/NO/DATE:22363} My questions have been answered, the writing is legible, and I understand these instructions.  I will adhere to these goals & educational materials that have been provided to me after my discharge from the hospital.

## 2011-09-03 NOTE — Discharge Summary (Signed)
Physician Discharge Summary  Patient ID: Sheila Patterson MRN: [id number] DOB/AGE: 55-Nov-1957 68 y.o.  Admit date: 08/25/2011 Discharge date: 09/03/2011  Admission Diagnoses: Subarachnoid hemorrhage  Discharge Diagnoses: Subarachnoid hemorrhage, non aneurysmal Principal Problem:  *SAH (subarachnoid hemorrhage)   Discharged Condition: good  Hospital Course: [name] was admitted secondary to a SAH on 08/25/11. She was on admission agitated, confused, and complaining of a severe headache for a three day period after a bowel movement. On hospital day 2 she was much improved mentally, but still had severe headaches. CT angio was negative for aneurysm. Most of the [facility] was perimesencephalic and her rapid neurologic improvement did fit with a nonaneurysmal bleed. Repeat ct's did show some mild ventricular dilatation, but there was no neurologic decline. I will discharge on day 10, day 13 post bleed.  On discharge her neuro exam displays no new deficits. She will stay with her mother for a few days after discharge.  Consults: None  Significant Diagnostic Studies: angiography: CT  Treatments: none  Discharge Exam: Blood pressure 122/77, pulse 103, temperature 98.2 F (36.8 C), temperature source Oral, resp. rate 20, height 5\' 5"  (1.651 m), weight 67.1 kg (147 lb 14.9 oz), SpO2 95.00%. alert, oriented x4 speech clear and fluent. Perrl, full eom, symmetric facies, tongue and uvula midline. Moving all extremities well.  Disposition: home  Discharge Orders    Future Appointments: Provider: Department: Dept Phone: Center:   12/04/2011 10:30 AM [facility] [phone] [facility]     Medication List  As of 09/03/2011  7:29 PM   TAKE these medications         acetaminophen 500 MG tablet   Commonly known as: TYLENOL   Take 500 mg by mouth every 6 (six) hours as needed. For pain      acetaminophen-codeine 300-30 MG per tablet   Commonly known as: TYLENOL #3   Take 1  tablet by mouth every 4 (four) hours as needed. For pain      SYSTANE FREE OP   Place 1 drop into both eyes daily as needed. For dry eyes             Signed: Karem Farha L 09/03/2011, 7:29 PM

## 2011-09-03 NOTE — Progress Notes (Signed)
Patient was discharged on stable condition. Pt education given and was able to understand and eager to accept. V/S stable.

## 2011-09-03 NOTE — Progress Notes (Signed)
Patient ID: Sheila Patterson, female   DOB: Jan 24, 1956, 56 y.o.   MRN: [id number] BP 98/66  Pulse 97  Temp(Src) 98.1 F (36.7 C) (Oral)  Resp 18  Ht 5\' 5"  (1.651 m)  Wt 67.1 kg (147 lb 14.9 oz)  BMI 24.62 kg/m2  SpO2 96% Alert and oriented x 4, speech clear and fluent Perrl, full eom Symmetric facies, tongue and uvula midline Headache improved today.  Possible dc tomorrow if feeling good.

## 2011-09-03 NOTE — Progress Notes (Signed)
Physical Therapy Treatment Patient Details Name: Sheila Patterson MRN: [id number] DOB: [birth year] Today's Date: 09/03/2011 Time: 4098-1191 PT Time Calculation (min): 11 min  PT Assessment / Plan / Recommendation Comments on Treatment Session  Pt progressing, although still with decreased balance and safety on high level balance activities. Spoke with pt and RN regarding safety in the hallways and once d/c. Will continue to see for safety upon d/c    Follow Up Recommendations  Outpatient PT    Barriers to Discharge        Equipment Recommendations  None recommended by OT    Recommendations for Other Services    Frequency Min 3X/week   Plan Frequency remains appropriate;Discharge plan needs to be updated    Precautions / Restrictions Precautions Precautions: Fall Restrictions Weight Bearing Restrictions: No       Mobility  Bed Mobility Bed Mobility: Supine to Sit;Sit to Supine;Sitting - Scoot to Edge of Bed Supine to Sit: 6: Modified independent (Device/Increase time) Sitting - Scoot to Edge of Bed: 6: Modified independent (Device/Increase time) Sit to Supine: 6: Modified independent (Device/Increase time) Transfers Transfers: Stand to Sit;Sit to Stand Sit to Stand: 5: Supervision;From bed Stand to Sit: 5: Supervision;To bed Details for Transfer Assistance: VC for safety Ambulation/Gait Ambulation/Gait Assistance: 4: Min guard;4: Min assist Ambulation Distance (Feet): 200 Feet Assistive device: None Ambulation/Gait Assistance Details: Pt with 2 loss of balances during ambulation trial. Difficulty with high level balance activities, see DGI. Gait Pattern: Step-through pattern;Decreased stride length;Narrow base of support Gait velocity: decreased gait speed Stairs: Yes Stairs Assistance: 5: Supervision Stairs Assistance Details (indicate cue type and reason): VC for safety on stairs Stair Management Technique: Two rails;Forwards Number of Stairs: 5     Exercises      PT Diagnosis:    PT Problem List:   PT Treatment Interventions:     PT Goals Acute Rehab PT Goals PT Goal Formulation: With patient PT Goal: Supine/Side to Sit - Progress: Met PT Goal: Sit to Supine/Side - Progress: Met PT Goal: Sit to Stand - Progress: Progressing toward goal PT Goal: Stand to Sit - Progress: Progressing toward goal PT Transfer Goal: Bed to Chair/Chair to Bed - Progress: Progressing toward goal PT Goal: Ambulate - Progress: Progressing toward goal PT Goal: Up/Down Stairs - Progress: Progressing toward goal  Visit Information  Last PT Received On: 09/03/11    Subjective Data      Cognition  Overall Cognitive Status: Impaired Area of Impairment: Safety/judgement Safety/Judgement: Decreased safety judgement for tasks assessed;Decreased awareness of need for assistance    Balance  Standardized Balance Assessment Standardized Balance Assessment: Dynamic Gait Index Dynamic Gait Index Level Surface: Normal Change in Gait Speed: Moderate Impairment Gait with Horizontal Head Turns: Mild Impairment Gait with Vertical Head Turns: Mild Impairment Gait and Pivot Turn: Mild Impairment Step Over Obstacle: Mild Impairment Step Around Obstacles: Normal Steps: Mild Impairment Total Score: 17   End of Session PT - End of Session Equipment Utilized During Treatment: Gait belt Activity Tolerance: Patient tolerated treatment well Patient left: in bed;with call bell/phone within reach Nurse Communication: Mobility status    Milana Kidney 09/03/2011, 4:41 PM

## 2011-09-24 DIAGNOSIS — S060X9A Concussion with loss of consciousness of unspecified duration, initial encounter: Secondary | ICD-10-CM | POA: Diagnosis not present

## 2011-12-04 ENCOUNTER — Ambulatory Visit
Admission: RE | Admit: 2011-12-04 | Discharge: 2011-12-04 | Disposition: A | Discharge status: Home / Self Care | Payer: Medicare Other | Source: Ambulatory Visit | Attending: Family Medicine | Admitting: Family Medicine

## 2011-12-04 DIAGNOSIS — Z1231 Encounter for screening mammogram for malignant neoplasm of breast: Secondary | ICD-10-CM

## 2011-12-16 DIAGNOSIS — L0292 Furuncle, unspecified: Secondary | ICD-10-CM | POA: Diagnosis not present

## 2011-12-16 DIAGNOSIS — A499 Bacterial infection, unspecified: Secondary | ICD-10-CM | POA: Diagnosis not present

## 2011-12-16 DIAGNOSIS — L0293 Carbuncle, unspecified: Secondary | ICD-10-CM | POA: Diagnosis not present

## 2011-12-16 DIAGNOSIS — L259 Unspecified contact dermatitis, unspecified cause: Secondary | ICD-10-CM | POA: Diagnosis not present

## 2012-01-12 DIAGNOSIS — J309 Allergic rhinitis, unspecified: Secondary | ICD-10-CM | POA: Diagnosis not present

## 2012-01-12 DIAGNOSIS — J019 Acute sinusitis, unspecified: Secondary | ICD-10-CM | POA: Diagnosis not present

## 2012-01-24 ENCOUNTER — Emergency Department (HOSPITAL_COMMUNITY): Payer: Medicare Other

## 2012-01-24 ENCOUNTER — Encounter (HOSPITAL_COMMUNITY): Payer: Self-pay | Admitting: Radiology

## 2012-01-24 ENCOUNTER — Emergency Department (HOSPITAL_COMMUNITY)
Admission: EM | Admit: 2012-01-24 | Discharge: 2012-01-24 | Disposition: A | Discharge status: Home / Self Care | Payer: Medicare Other | Attending: Emergency Medicine | Admitting: Emergency Medicine

## 2012-01-24 DIAGNOSIS — R51 Headache: Secondary | ICD-10-CM | POA: Diagnosis not present

## 2012-01-24 DIAGNOSIS — G8929 Other chronic pain: Secondary | ICD-10-CM | POA: Diagnosis not present

## 2012-01-24 DIAGNOSIS — R110 Nausea: Secondary | ICD-10-CM | POA: Diagnosis not present

## 2012-01-24 DIAGNOSIS — H53149 Visual discomfort, unspecified: Secondary | ICD-10-CM | POA: Diagnosis not present

## 2012-01-24 DIAGNOSIS — R6889 Other general symptoms and signs: Secondary | ICD-10-CM | POA: Diagnosis not present

## 2012-01-24 DIAGNOSIS — Z853 Personal history of malignant neoplasm of breast: Secondary | ICD-10-CM | POA: Insufficient documentation

## 2012-01-24 MED ORDER — DIPHENHYDRAMINE HCL 50 MG/ML IJ SOLN
25.0000 mg | Freq: Once | INTRAMUSCULAR | Status: AC
  Administered 2012-01-24: 25 mg via INTRAVENOUS
  Filled 2012-01-24: qty 1

## 2012-01-24 MED ORDER — METOCLOPRAMIDE HCL 10 MG PO TABS: 10.0000 mg | ORAL_TABLET | Freq: Four times a day (QID) | ORAL | Status: DC

## 2012-01-24 MED ORDER — HYDROCODONE-ACETAMINOPHEN 5-325 MG PO TABS: ORAL_TABLET | ORAL | Status: DC

## 2012-01-24 MED ORDER — DEXAMETHASONE SODIUM PHOSPHATE 10 MG/ML IJ SOLN
10.0000 mg | Freq: Once | INTRAMUSCULAR | Status: AC
  Administered 2012-01-24: 10 mg via INTRAVENOUS
  Filled 2012-01-24: qty 1

## 2012-01-24 MED ORDER — METOCLOPRAMIDE HCL 5 MG/ML IJ SOLN
10.0000 mg | Freq: Once | INTRAMUSCULAR | Status: AC
  Administered 2012-01-24: 10 mg via INTRAVENOUS
  Filled 2012-01-24: qty 2

## 2012-01-24 NOTE — Discharge Instructions (Signed)
 General Headache Without Cause A general headache is pain or discomfort felt around the head or neck area. The cause may not be found.  HOME CARE   Keep all doctor visits.  Only take medicines as told by your doctor.  Lie down in a dark, quiet room when you have a headache.  Keep a journal to find out if certain things bring on headaches. For example, write down:  What you eat and drink.  How much sleep you get.  Any change to your diet or medicines.  Relax by getting a massage or doing other relaxing activities.  Put ice or heat packs on the head and neck area as told by your doctor.  Lessen stress.  Sit up straight. Do not tighten (tense) your muscles.  Quit smoking if you smoke.  Lessen how much alcohol you drink.  Lessen how much caffeine you drink, or stop drinking caffeine.  Eat and sleep on a regular schedule.  Get 7 to 9 hours of sleep, or as told by your doctor.  Keep lights dim if bright lights bother you or make your headaches worse. GET HELP RIGHT AWAY IF:   Your headache becomes really bad.  You have a fever.  You have a stiff neck.  You have trouble seeing.  Your muscles are weak, or you lose muscle control.  You lose your balance or have trouble walking.  You feel like you will pass out (faint), or you pass out.  You have really bad symptoms that are different than your first symptoms.  You have problems with the medicines given to you by your doctor.  Your medicines do not work.  Your headache feels different than the other headaches.  You feel sick to your stomach (nauseous) or throw up (vomit). MAKE SURE YOU:   Understand these instructions.  Will watch your condition.  Will get help right away if you are not doing well or get worse. Document Released: 12/31/2007 Document Revised: 06/15/2011 Document Reviewed: 03/13/2011 [facility] Patient Information 2013 [place], [place].   Narcotic and benzodiazepine use may cause drowsiness,  slowed breathing or dependence.  Please use with caution and do not drive, operate machinery or watch young children alone while taking them.  Taking combinations of these medications or drinking alcohol will potentiate these effects.       I recommend taking the Reglan  for headaches for now, if not improving, you may use the Norco as prescribed, but there is the possibility of having rebound headaches.  I strongly recommend that you follow up with Dr. Maree for chronic headaches.
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
## 2012-01-24 NOTE — ED Notes (Signed)
MD at bedside. 

## 2012-01-24 NOTE — ED Notes (Signed)
Pt presents with HE X 2 days. Pt reports N but denies V and changes in vision, tingling numbness. Pt has a Hx of CVA X 2 right side no residual deficits

## 2012-01-26 DIAGNOSIS — B0239 Other herpes zoster eye disease: Secondary | ICD-10-CM | POA: Diagnosis not present

## 2012-01-26 DIAGNOSIS — B029 Zoster without complications: Secondary | ICD-10-CM | POA: Diagnosis not present

## 2012-02-02 DIAGNOSIS — B0232 Zoster iridocyclitis: Secondary | ICD-10-CM | POA: Diagnosis not present

## 2012-02-08 DIAGNOSIS — Z131 Encounter for screening for diabetes mellitus: Secondary | ICD-10-CM | POA: Diagnosis not present

## 2012-02-08 DIAGNOSIS — Z23 Encounter for immunization: Secondary | ICD-10-CM | POA: Diagnosis not present

## 2012-02-08 DIAGNOSIS — B029 Zoster without complications: Secondary | ICD-10-CM | POA: Diagnosis not present

## 2012-02-08 DIAGNOSIS — Z1211 Encounter for screening for malignant neoplasm of colon: Secondary | ICD-10-CM | POA: Diagnosis not present

## 2012-02-08 DIAGNOSIS — Z0000 Encounter for general adult medical examination without abnormal findings: Secondary | ICD-10-CM | POA: Diagnosis not present

## 2012-02-08 DIAGNOSIS — Z136 Encounter for screening for cardiovascular disorders: Secondary | ICD-10-CM | POA: Diagnosis not present

## 2012-02-08 DIAGNOSIS — Z79899 Other long term (current) drug therapy: Secondary | ICD-10-CM | POA: Diagnosis not present

## 2012-02-08 DIAGNOSIS — Z13220 Encounter for screening for lipoid disorders: Secondary | ICD-10-CM | POA: Diagnosis not present

## 2012-02-08 DIAGNOSIS — Z1331 Encounter for screening for depression: Secondary | ICD-10-CM | POA: Diagnosis not present

## 2012-02-09 DIAGNOSIS — B0232 Zoster iridocyclitis: Secondary | ICD-10-CM | POA: Diagnosis not present

## 2012-02-23 DIAGNOSIS — B0229 Other postherpetic nervous system involvement: Secondary | ICD-10-CM | POA: Diagnosis not present

## 2012-03-07 DIAGNOSIS — B0229 Other postherpetic nervous system involvement: Secondary | ICD-10-CM | POA: Diagnosis not present

## 2012-03-07 DIAGNOSIS — S060X9A Concussion with loss of consciousness of unspecified duration, initial encounter: Secondary | ICD-10-CM | POA: Diagnosis not present

## 2012-04-01 ENCOUNTER — Other Ambulatory Visit: Payer: Self-pay | Admitting: Family Medicine

## 2012-04-01 DIAGNOSIS — Z853 Personal history of malignant neoplasm of breast: Secondary | ICD-10-CM

## 2012-04-01 DIAGNOSIS — Z1231 Encounter for screening mammogram for malignant neoplasm of breast: Secondary | ICD-10-CM

## 2012-04-01 DIAGNOSIS — Z9011 Acquired absence of right breast and nipple: Secondary | ICD-10-CM

## 2012-04-01 DIAGNOSIS — Z803 Family history of malignant neoplasm of breast: Secondary | ICD-10-CM

## 2012-05-11 DIAGNOSIS — D2370 Other benign neoplasm of skin of unspecified lower limb, including hip: Secondary | ICD-10-CM | POA: Diagnosis not present

## 2012-05-11 DIAGNOSIS — B353 Tinea pedis: Secondary | ICD-10-CM | POA: Diagnosis not present

## 2012-07-27 DIAGNOSIS — Z0000 Encounter for general adult medical examination without abnormal findings: Secondary | ICD-10-CM | POA: Diagnosis not present

## 2012-07-27 DIAGNOSIS — R05 Cough: Secondary | ICD-10-CM | POA: Diagnosis not present

## 2012-09-01 DIAGNOSIS — E8941 Symptomatic postprocedural ovarian failure: Secondary | ICD-10-CM | POA: Diagnosis not present

## 2012-09-08 DIAGNOSIS — S060X9A Concussion with loss of consciousness of unspecified duration, initial encounter: Secondary | ICD-10-CM | POA: Diagnosis not present

## 2012-09-08 DIAGNOSIS — B0229 Other postherpetic nervous system involvement: Secondary | ICD-10-CM | POA: Diagnosis not present

## 2012-12-06 ENCOUNTER — Ambulatory Visit: Payer: Medicare Other

## 2012-12-07 ENCOUNTER — Ambulatory Visit
Admission: RE | Admit: 2012-12-07 | Discharge: 2012-12-07 | Disposition: A | Discharge status: Home / Self Care | Payer: Medicare Other | Source: Ambulatory Visit | Attending: Family Medicine | Admitting: Family Medicine

## 2012-12-07 ENCOUNTER — Other Ambulatory Visit: Payer: Self-pay | Admitting: Family Medicine

## 2012-12-07 DIAGNOSIS — Z9011 Acquired absence of right breast and nipple: Secondary | ICD-10-CM

## 2012-12-07 DIAGNOSIS — Z803 Family history of malignant neoplasm of breast: Secondary | ICD-10-CM

## 2012-12-07 DIAGNOSIS — Z1231 Encounter for screening mammogram for malignant neoplasm of breast: Secondary | ICD-10-CM

## 2012-12-07 DIAGNOSIS — Z853 Personal history of malignant neoplasm of breast: Secondary | ICD-10-CM

## 2012-12-28 DIAGNOSIS — H9319 Tinnitus, unspecified ear: Secondary | ICD-10-CM | POA: Diagnosis not present

## 2012-12-28 DIAGNOSIS — H6120 Impacted cerumen, unspecified ear: Secondary | ICD-10-CM | POA: Diagnosis not present

## 2013-02-13 DIAGNOSIS — F411 Generalized anxiety disorder: Secondary | ICD-10-CM | POA: Diagnosis not present

## 2013-02-13 DIAGNOSIS — Z853 Personal history of malignant neoplasm of breast: Secondary | ICD-10-CM | POA: Diagnosis not present

## 2013-02-13 DIAGNOSIS — Z0000 Encounter for general adult medical examination without abnormal findings: Secondary | ICD-10-CM | POA: Diagnosis not present

## 2013-02-13 DIAGNOSIS — K589 Irritable bowel syndrome without diarrhea: Secondary | ICD-10-CM | POA: Diagnosis not present

## 2013-02-13 DIAGNOSIS — Z23 Encounter for immunization: Secondary | ICD-10-CM | POA: Diagnosis not present

## 2013-02-13 DIAGNOSIS — E782 Mixed hyperlipidemia: Secondary | ICD-10-CM | POA: Diagnosis not present

## 2013-02-13 DIAGNOSIS — M79609 Pain in unspecified limb: Secondary | ICD-10-CM | POA: Diagnosis not present

## 2013-02-13 DIAGNOSIS — M899 Disorder of bone, unspecified: Secondary | ICD-10-CM | POA: Diagnosis not present

## 2013-02-13 DIAGNOSIS — Z8673 Personal history of transient ischemic attack (TIA), and cerebral infarction without residual deficits: Secondary | ICD-10-CM | POA: Diagnosis not present

## 2013-03-14 DIAGNOSIS — M898X9 Other specified disorders of bone, unspecified site: Secondary | ICD-10-CM | POA: Diagnosis not present

## 2013-03-14 DIAGNOSIS — M659 Synovitis and tenosynovitis, unspecified: Secondary | ICD-10-CM | POA: Diagnosis not present

## 2013-03-16 DIAGNOSIS — S060X9A Concussion with loss of consciousness of unspecified duration, initial encounter: Secondary | ICD-10-CM | POA: Diagnosis not present

## 2013-03-16 DIAGNOSIS — B0229 Other postherpetic nervous system involvement: Secondary | ICD-10-CM | POA: Diagnosis not present

## 2013-03-17 DIAGNOSIS — R51 Headache: Secondary | ICD-10-CM | POA: Diagnosis not present

## 2013-03-17 DIAGNOSIS — J3489 Other specified disorders of nose and nasal sinuses: Secondary | ICD-10-CM | POA: Diagnosis not present

## 2013-04-06 HISTORY — PX: BREAST BIOPSY: SHX20

## 2013-04-11 DIAGNOSIS — M659 Synovitis and tenosynovitis, unspecified: Secondary | ICD-10-CM | POA: Diagnosis not present

## 2013-04-11 DIAGNOSIS — M898X9 Other specified disorders of bone, unspecified site: Secondary | ICD-10-CM | POA: Diagnosis not present

## 2013-04-21 DIAGNOSIS — Z1211 Encounter for screening for malignant neoplasm of colon: Secondary | ICD-10-CM | POA: Diagnosis not present

## 2013-05-09 DIAGNOSIS — M898X9 Other specified disorders of bone, unspecified site: Secondary | ICD-10-CM | POA: Diagnosis not present

## 2013-05-09 DIAGNOSIS — M659 Synovitis and tenosynovitis, unspecified: Secondary | ICD-10-CM | POA: Diagnosis not present

## 2013-05-16 DIAGNOSIS — H60399 Other infective otitis externa, unspecified ear: Secondary | ICD-10-CM | POA: Diagnosis not present

## 2013-05-22 DIAGNOSIS — H60399 Other infective otitis externa, unspecified ear: Secondary | ICD-10-CM | POA: Diagnosis not present

## 2013-05-22 DIAGNOSIS — H6120 Impacted cerumen, unspecified ear: Secondary | ICD-10-CM | POA: Diagnosis not present

## 2013-06-06 DIAGNOSIS — D235 Other benign neoplasm of skin of trunk: Secondary | ICD-10-CM | POA: Diagnosis not present

## 2013-06-06 DIAGNOSIS — D234 Other benign neoplasm of skin of scalp and neck: Secondary | ICD-10-CM | POA: Diagnosis not present

## 2013-06-13 DIAGNOSIS — M79609 Pain in unspecified limb: Secondary | ICD-10-CM | POA: Diagnosis not present

## 2013-06-13 DIAGNOSIS — M659 Synovitis and tenosynovitis, unspecified: Secondary | ICD-10-CM | POA: Diagnosis not present

## 2013-06-13 DIAGNOSIS — M898X9 Other specified disorders of bone, unspecified site: Secondary | ICD-10-CM | POA: Diagnosis not present

## 2013-06-15 DIAGNOSIS — L255 Unspecified contact dermatitis due to plants, except food: Secondary | ICD-10-CM | POA: Diagnosis not present

## 2013-06-28 DIAGNOSIS — J069 Acute upper respiratory infection, unspecified: Secondary | ICD-10-CM | POA: Diagnosis not present

## 2013-10-08 IMAGING — MG MM SCREEN MAMMOGRAM BILATERAL
2 series · 2 of 2 positions shown · non-contrast
Comparison: Previous exam(s).

CLINICAL DATA: Screening.

DIGITAL SCREENING UNILATERAL LEFT MAMMOGRAM WITH CAD

[L CC]
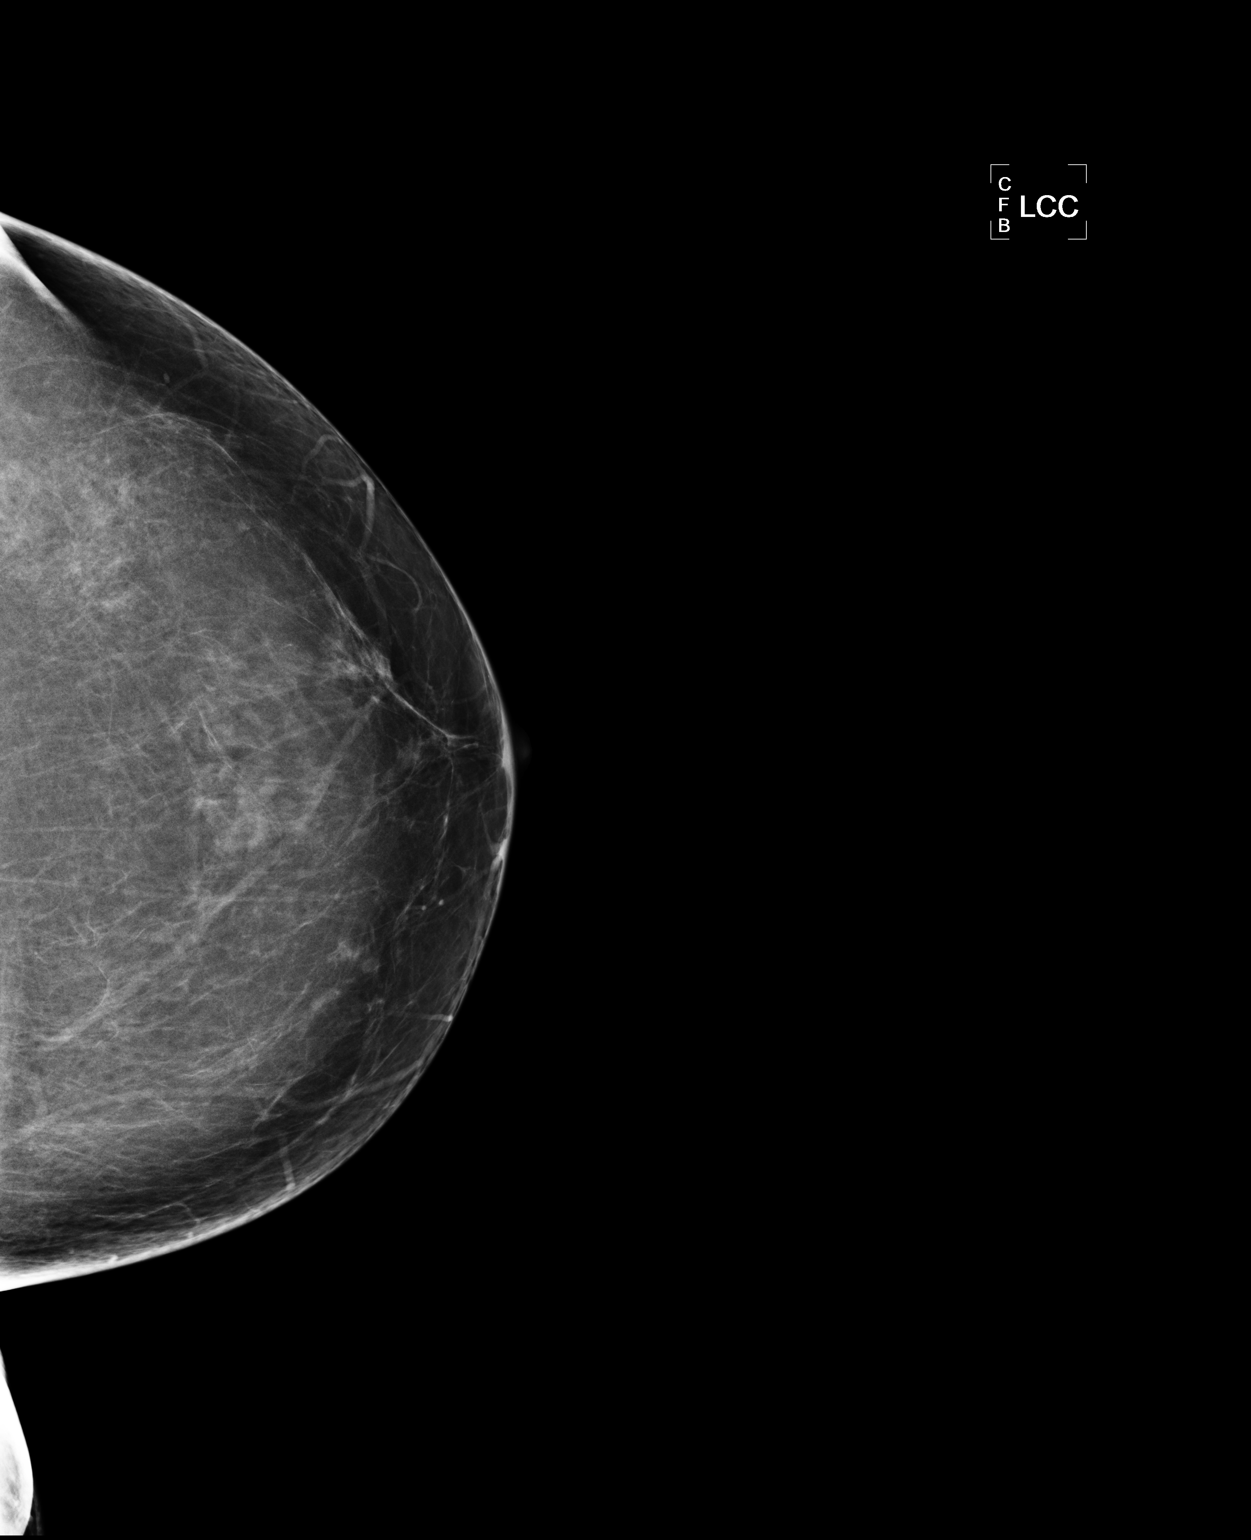

[L MLO]
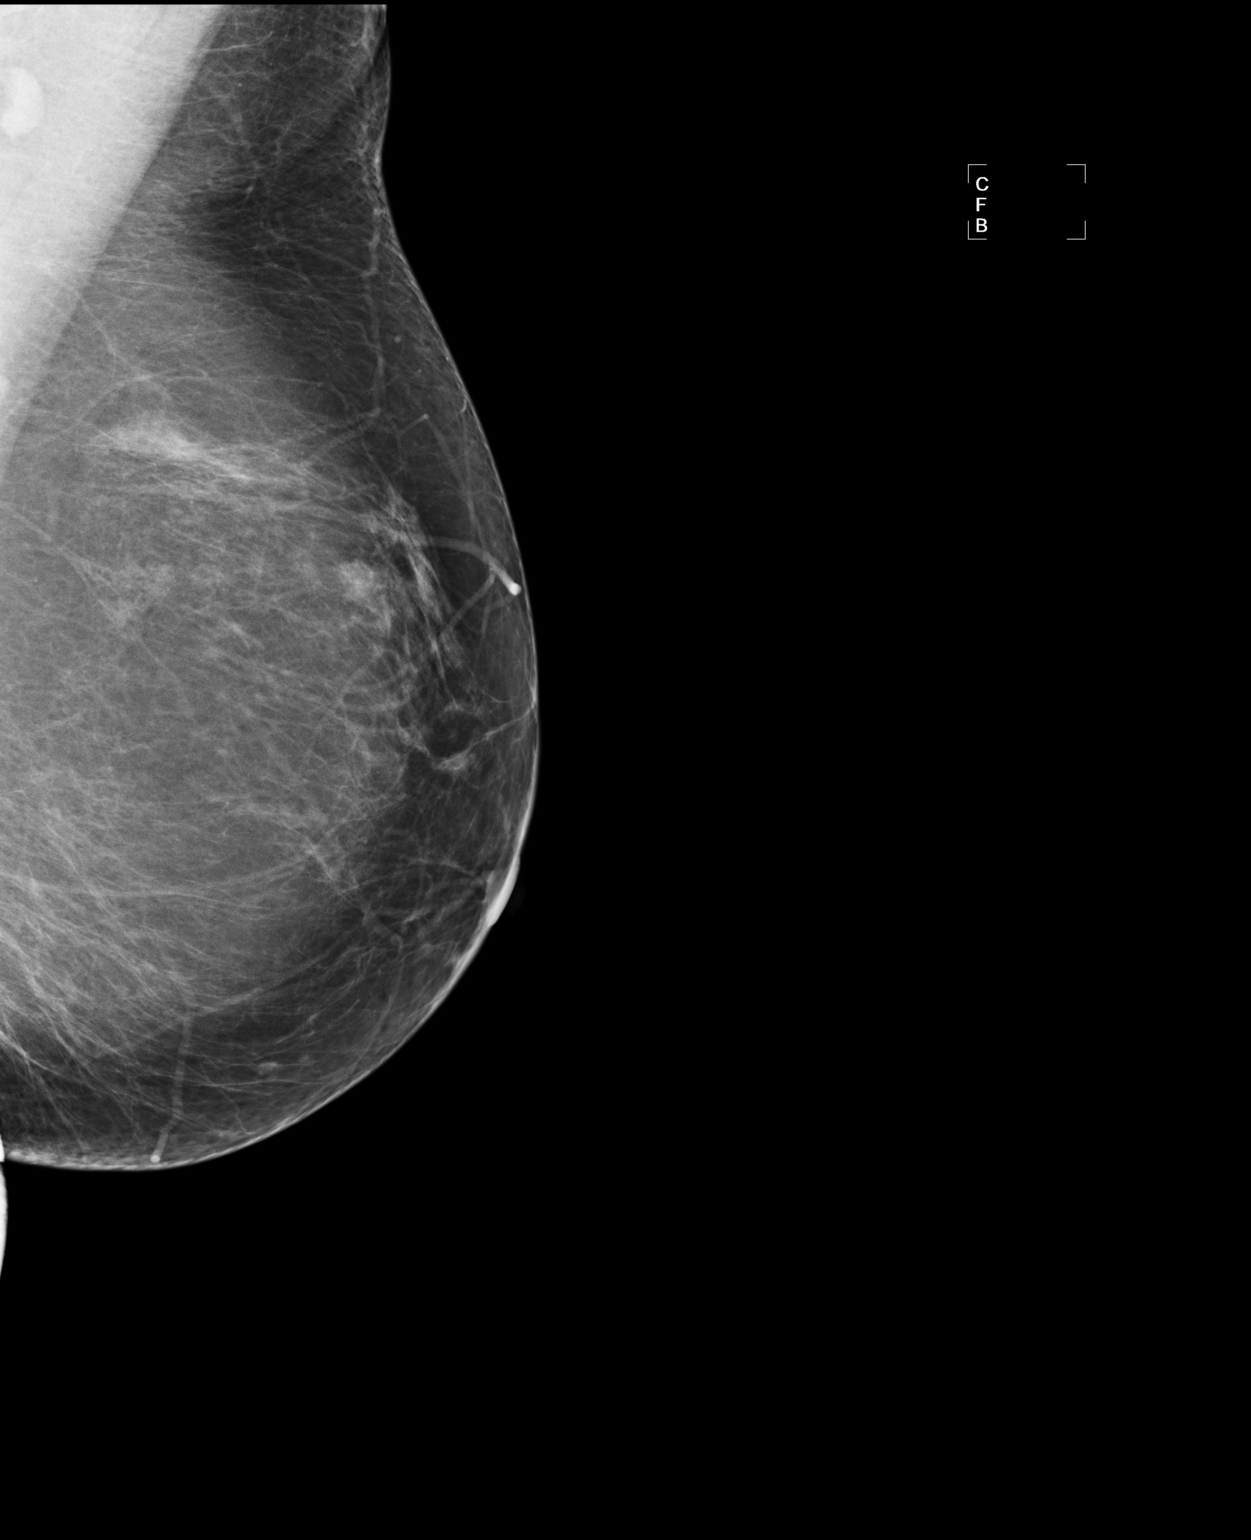

[2 of 2 positions shown; findings below may reference images not displayed]

FINDINGS: ACR Breast Density Category a:  The breast tissue is almost
entirely fatty.

There are no findings suspicious for malignancy.

Images were processed with CAD.
IMPRESSION: No mammographic evidence of malignancy.

A result letter of this screening mammogram will be mailed directly
to the patient.

RECOMMENDATION:
Screening mammogram in one year. (Code:J4-9-2SU)

BI-RADS CATEGORY 1:  Negative.

## 2013-11-07 ENCOUNTER — Other Ambulatory Visit: Payer: Self-pay

## 2013-11-07 DIAGNOSIS — Z853 Personal history of malignant neoplasm of breast: Secondary | ICD-10-CM

## 2013-11-07 DIAGNOSIS — Z9011 Acquired absence of right breast and nipple: Secondary | ICD-10-CM

## 2013-11-07 DIAGNOSIS — Z1231 Encounter for screening mammogram for malignant neoplasm of breast: Secondary | ICD-10-CM

## 2013-11-07 DIAGNOSIS — Z803 Family history of malignant neoplasm of breast: Secondary | ICD-10-CM

## 2013-12-12 DIAGNOSIS — S060X9A Concussion with loss of consciousness of unspecified duration, initial encounter: Secondary | ICD-10-CM | POA: Diagnosis not present

## 2013-12-12 DIAGNOSIS — B0229 Other postherpetic nervous system involvement: Secondary | ICD-10-CM | POA: Diagnosis not present

## 2013-12-15 ENCOUNTER — Ambulatory Visit: Payer: Medicare Other

## 2013-12-15 ENCOUNTER — Ambulatory Visit
Admission: RE | Admit: 2013-12-15 | Discharge: 2013-12-15 | Disposition: A | Discharge status: Home / Self Care | Payer: Medicare Other | Source: Ambulatory Visit

## 2013-12-15 DIAGNOSIS — Z853 Personal history of malignant neoplasm of breast: Secondary | ICD-10-CM | POA: Diagnosis not present

## 2013-12-15 DIAGNOSIS — Z1231 Encounter for screening mammogram for malignant neoplasm of breast: Secondary | ICD-10-CM

## 2013-12-15 DIAGNOSIS — Z9011 Acquired absence of right breast and nipple: Secondary | ICD-10-CM

## 2013-12-15 DIAGNOSIS — Z803 Family history of malignant neoplasm of breast: Secondary | ICD-10-CM

## 2013-12-18 ENCOUNTER — Other Ambulatory Visit: Payer: Self-pay | Admitting: Family Medicine

## 2013-12-18 DIAGNOSIS — R928 Other abnormal and inconclusive findings on diagnostic imaging of breast: Secondary | ICD-10-CM

## 2013-12-26 ENCOUNTER — Ambulatory Visit
Admission: RE | Admit: 2013-12-26 | Discharge: 2013-12-26 | Disposition: A | Discharge status: Home / Self Care | Payer: Medicare Other | Source: Ambulatory Visit | Attending: Family Medicine | Admitting: Family Medicine

## 2013-12-26 ENCOUNTER — Other Ambulatory Visit: Payer: Self-pay | Admitting: Family Medicine

## 2013-12-26 DIAGNOSIS — R928 Other abnormal and inconclusive findings on diagnostic imaging of breast: Secondary | ICD-10-CM

## 2013-12-26 DIAGNOSIS — Z853 Personal history of malignant neoplasm of breast: Secondary | ICD-10-CM | POA: Diagnosis not present

## 2013-12-26 DIAGNOSIS — N63 Unspecified lump in unspecified breast: Secondary | ICD-10-CM

## 2013-12-26 DIAGNOSIS — N6489 Other specified disorders of breast: Secondary | ICD-10-CM | POA: Diagnosis not present

## 2014-01-03 ENCOUNTER — Ambulatory Visit
Admission: RE | Admit: 2014-01-03 | Discharge: 2014-01-03 | Disposition: A | Discharge status: Home / Self Care | Payer: Medicare Other | Source: Ambulatory Visit | Attending: Family Medicine | Admitting: Family Medicine

## 2014-01-03 ENCOUNTER — Other Ambulatory Visit: Payer: Self-pay | Admitting: Family Medicine

## 2014-01-03 DIAGNOSIS — N63 Unspecified lump in unspecified breast: Secondary | ICD-10-CM

## 2014-01-03 DIAGNOSIS — N6019 Diffuse cystic mastopathy of unspecified breast: Secondary | ICD-10-CM | POA: Diagnosis not present

## 2014-01-03 DIAGNOSIS — N62 Hypertrophy of breast: Secondary | ICD-10-CM | POA: Diagnosis not present

## 2014-01-03 DIAGNOSIS — Z9010 Acquired absence of unspecified breast and nipple: Secondary | ICD-10-CM | POA: Diagnosis not present

## 2014-01-03 DIAGNOSIS — N6089 Other benign mammary dysplasias of unspecified breast: Secondary | ICD-10-CM | POA: Diagnosis not present

## 2014-02-02 DIAGNOSIS — M791 Myalgia: Secondary | ICD-10-CM | POA: Diagnosis not present

## 2014-02-02 DIAGNOSIS — M7062 Trochanteric bursitis, left hip: Secondary | ICD-10-CM | POA: Diagnosis not present

## 2014-02-02 DIAGNOSIS — M25552 Pain in left hip: Secondary | ICD-10-CM | POA: Diagnosis not present

## 2014-04-19 DIAGNOSIS — Z87891 Personal history of nicotine dependence: Secondary | ICD-10-CM | POA: Diagnosis not present

## 2014-04-19 DIAGNOSIS — Z8673 Personal history of transient ischemic attack (TIA), and cerebral infarction without residual deficits: Secondary | ICD-10-CM | POA: Diagnosis not present

## 2014-04-19 DIAGNOSIS — Z8661 Personal history of infections of the central nervous system: Secondary | ICD-10-CM | POA: Diagnosis not present

## 2014-04-19 DIAGNOSIS — R51 Headache: Secondary | ICD-10-CM | POA: Diagnosis not present

## 2014-04-19 DIAGNOSIS — Z79899 Other long term (current) drug therapy: Secondary | ICD-10-CM | POA: Diagnosis not present

## 2014-04-19 DIAGNOSIS — Z7982 Long term (current) use of aspirin: Secondary | ICD-10-CM | POA: Diagnosis not present

## 2014-04-19 DIAGNOSIS — H6120 Impacted cerumen, unspecified ear: Secondary | ICD-10-CM | POA: Diagnosis not present

## 2014-04-19 DIAGNOSIS — Z853 Personal history of malignant neoplasm of breast: Secondary | ICD-10-CM | POA: Diagnosis not present

## 2014-06-08 ENCOUNTER — Encounter: Payer: Self-pay | Admitting: Family Medicine

## 2014-06-08 ENCOUNTER — Ambulatory Visit (INDEPENDENT_AMBULATORY_CARE_PROVIDER_SITE_OTHER): Payer: Medicare Other | Admitting: Family Medicine

## 2014-06-08 VITALS — BP 125/76 | HR 79 | Temp 98.5°F | Ht 64.0 in | Wt 161.8 lb

## 2014-06-08 DIAGNOSIS — E785 Hyperlipidemia, unspecified: Secondary | ICD-10-CM | POA: Diagnosis not present

## 2014-06-08 DIAGNOSIS — Z139 Encounter for screening, unspecified: Secondary | ICD-10-CM | POA: Diagnosis not present

## 2014-06-08 DIAGNOSIS — R5383 Other fatigue: Secondary | ICD-10-CM | POA: Diagnosis not present

## 2014-06-08 DIAGNOSIS — G44039 Episodic paroxysmal hemicrania, not intractable: Secondary | ICD-10-CM

## 2014-06-08 LAB — POCT CBC
Granulocyte percent: 66.5 %G (ref 37–80)
HCT, POC: 50.2 % — AB (ref 37.7–47.9)
HEMOGLOBIN: 14.8 g/dL (ref 12.2–16.2)
LYMPH, POC: 2.1 (ref 0.6–3.4)
MCH: 26.2 pg — AB (ref 27–31.2)
MCHC: 29.5 g/dL — AB (ref 31.8–35.4)
MCV: 88.9 fL (ref 80–97)
MPV: 8 fL (ref 0–99.8)
PLATELET COUNT, POC: 329 10*3/uL (ref 142–424)
POC GRANULOCYTE: 4.5 (ref 2–6.9)
POC LYMPH %: 30.5 % (ref 10–50)
RBC: 5.65 M/uL — AB (ref 4.04–5.48)
RDW, POC: 13.6 %
WBC: 6.8 10*3/uL (ref 4.6–10.2)

## 2014-06-09 LAB — LIPID PANEL
CHOLESTEROL TOTAL: 224 mg/dL — AB (ref 100–199)
Chol/HDL Ratio: 5.2 ratio units — ABNORMAL HIGH (ref 0.0–4.4)
HDL: 43 mg/dL (ref >39)
LDL Calculated: 149 mg/dL — ABNORMAL HIGH (ref 0–99)
Triglycerides: 159 mg/dL — ABNORMAL HIGH (ref 0–149)
VLDL CHOLESTEROL CAL: 32 mg/dL (ref 5–40)

## 2014-06-09 LAB — CMP14+EGFR
ALK PHOS: 90 IU/L (ref 39–117)
ALT: 67 IU/L — ABNORMAL HIGH (ref 0–32)
AST: 48 IU/L — AB (ref 0–40)
Albumin/Globulin Ratio: 1.7 (ref 1.1–2.5)
Albumin: 4.6 g/dL (ref 3.5–5.5)
BILIRUBIN TOTAL: 1.1 mg/dL (ref 0.0–1.2)
BUN/Creatinine Ratio: 22 (ref 9–23)
BUN: 15 mg/dL (ref 6–24)
CALCIUM: 9.6 mg/dL (ref 8.7–10.2)
CO2: 23 mmol/L (ref 18–29)
Chloride: 101 mmol/L (ref 97–108)
Creatinine, Ser: 0.67 mg/dL (ref 0.57–1.00)
GFR calc Af Amer: 113 mL/min/{1.73_m2} (ref >59)
GFR calc non Af Amer: 98 mL/min/{1.73_m2} (ref >59)
Globulin, Total: 2.7 g/dL (ref 1.5–4.5)
Glucose: 89 mg/dL (ref 65–99)
POTASSIUM: 5 mmol/L (ref 3.5–5.2)
SODIUM: 140 mmol/L (ref 134–144)
TOTAL PROTEIN: 7.3 g/dL (ref 6.0–8.5)

## 2014-06-09 LAB — THYROID PANEL WITH TSH
Free Thyroxine Index: 2.5 (ref 1.2–4.9)
T3 Uptake Ratio: 24 % (ref 24–39)
T4 TOTAL: 10.4 ug/dL (ref 4.5–12.0)
TSH: 0.737 u[IU]/mL (ref 0.450–4.500)

## 2014-06-11 ENCOUNTER — Encounter: Payer: Self-pay | Admitting: Family Medicine

## 2014-06-12 DIAGNOSIS — S069X9A Unspecified intracranial injury with loss of consciousness of unspecified duration, initial encounter: Secondary | ICD-10-CM | POA: Diagnosis not present

## 2014-06-12 DIAGNOSIS — B0229 Other postherpetic nervous system involvement: Secondary | ICD-10-CM | POA: Diagnosis not present
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
## 2014-08-28 DIAGNOSIS — L219 Seborrheic dermatitis, unspecified: Secondary | ICD-10-CM | POA: Diagnosis not present

## 2014-08-28 DIAGNOSIS — L821 Other seborrheic keratosis: Secondary | ICD-10-CM | POA: Diagnosis not present

## 2014-11-12 ENCOUNTER — Other Ambulatory Visit: Payer: Self-pay

## 2014-11-12 DIAGNOSIS — Z1231 Encounter for screening mammogram for malignant neoplasm of breast: Secondary | ICD-10-CM

## 2014-12-11 ENCOUNTER — Ambulatory Visit (INDEPENDENT_AMBULATORY_CARE_PROVIDER_SITE_OTHER): Payer: Medicare Other | Admitting: Family Medicine

## 2014-12-11 ENCOUNTER — Encounter (INDEPENDENT_AMBULATORY_CARE_PROVIDER_SITE_OTHER): Payer: Self-pay

## 2014-12-11 ENCOUNTER — Encounter: Payer: Self-pay | Admitting: Family Medicine

## 2014-12-11 VITALS — BP 113/73 | HR 69 | Temp 97.1°F | Ht 64.0 in | Wt 156.0 lb

## 2014-12-11 DIAGNOSIS — G44039 Episodic paroxysmal hemicrania, not intractable: Secondary | ICD-10-CM

## 2014-12-11 DIAGNOSIS — E785 Hyperlipidemia, unspecified: Secondary | ICD-10-CM | POA: Diagnosis not present

## 2014-12-11 NOTE — Patient Instructions (Signed)
Fat and Cholesterol Control Diet Fat and cholesterol levels in your blood and organs are influenced by your diet. High levels of fat and cholesterol may lead to diseases of the heart, small and large blood vessels, gallbladder, liver, and pancreas. CONTROLLING FAT AND CHOLESTEROL WITH DIET Although exercise and lifestyle factors are important, your diet is key. That is because certain foods are known to raise cholesterol and others to lower it. The goal is to balance foods for their effect on cholesterol and more importantly, to replace saturated and trans fat with other types of fat, such as monounsaturated fat, polyunsaturated fat, and omega-3 fatty acids. On average, a person should consume no more than 15 to 17 g of saturated fat daily. Saturated and trans fats are considered "bad" fats, and they will raise LDL cholesterol. Saturated fats are primarily found in animal products such as meats, butter, and cream. However, that does not mean you need to give up all your favorite foods. Today, there are good tasting, low-fat, low-cholesterol substitutes for most of the things you like to eat. Choose low-fat or nonfat alternatives. Choose round or loin cuts of red meat. These types of cuts are lowest in fat and cholesterol. Chicken (without the skin), fish, veal, and ground turkey breast are great choices. Eliminate fatty meats, such as hot dogs and salami. Even shellfish have little or no saturated fat. Have a 3 oz (85 g) portion when you eat lean meat, poultry, or fish. Trans fats are also called "partially hydrogenated oils." They are oils that have been scientifically manipulated so that they are solid at room temperature resulting in a longer shelf life and improved taste and texture of foods in which they are added. Trans fats are found in stick margarine, some tub margarines, cookies, crackers, and baked goods.  When baking and cooking, oils are a great substitute for butter. The monounsaturated oils are  especially beneficial since it is believed they lower LDL and raise HDL. The oils you should avoid entirely are saturated tropical oils, such as coconut and palm.  Remember to eat a lot from food groups that are naturally free of saturated and trans fat, including fish, fruit, vegetables, beans, grains (barley, rice, couscous, bulgur wheat), and pasta (without cream sauces).  IDENTIFYING FOODS THAT LOWER FAT AND CHOLESTEROL  Soluble fiber may lower your cholesterol. This type of fiber is found in fruits such as apples, vegetables such as broccoli, potatoes, and carrots, legumes such as beans, peas, and lentils, and grains such as barley. Foods fortified with plant sterols (phytosterol) may also lower cholesterol. You should eat at least 2 g per day of these foods for a cholesterol lowering effect.  Read package labels to identify low-saturated fats, trans fat free, and low-fat foods at the supermarket. Select cheeses that have only 2 to 3 g saturated fat per ounce. Use a heart-healthy tub margarine that is free of trans fats or partially hydrogenated oil. When buying baked goods (cookies, crackers), avoid partially hydrogenated oils. Breads and muffins should be made from whole grains (whole-wheat or whole oat flour, instead of "flour" or "enriched flour"). Buy non-creamy canned soups with reduced salt and no added fats.  FOOD PREPARATION TECHNIQUES  Never deep-fry. If you must fry, either stir-fry, which uses very little fat, or use non-stick cooking sprays. When possible, broil, bake, or roast meats, and steam vegetables. Instead of putting butter or margarine on vegetables, use lemon and herbs, applesauce, and cinnamon (for squash and sweet potatoes). Use nonfat   yogurt, salsa, and low-fat dressings for salads.  LOW-SATURATED FAT / LOW-FAT FOOD SUBSTITUTES Meats / Saturated Fat (g)  Avoid: Steak, marbled (3 oz/85 g) / 11 g  Choose: Steak, lean (3 oz/85 g) / 4 g  Avoid: Hamburger (3 oz/85 g) / 7  g  Choose: Hamburger, lean (3 oz/85 g) / 5 g  Avoid: Ham (3 oz/85 g) / 6 g  Choose: Ham, lean cut (3 oz/85 g) / 2.4 g  Avoid: Chicken, with skin, dark meat (3 oz/85 g) / 4 g  Choose: Chicken, skin removed, dark meat (3 oz/85 g) / 2 g  Avoid: Chicken, with skin, light meat (3 oz/85 g) / 2.5 g  Choose: Chicken, skin removed, light meat (3 oz/85 g) / 1 g Dairy / Saturated Fat (g)  Avoid: Whole milk (1 cup) / 5 g  Choose: Low-fat milk, 2% (1 cup) / 3 g  Choose: Low-fat milk, 1% (1 cup) / 1.5 g  Choose: Skim milk (1 cup) / 0.3 g  Avoid: Hard cheese (1 oz/28 g) / 6 g  Choose: Skim milk cheese (1 oz/28 g) / 2 to 3 g  Avoid: Cottage cheese, 4% fat (1 cup) / 6.5 g  Choose: Low-fat cottage cheese, 1% fat (1 cup) / 1.5 g  Avoid: Ice cream (1 cup) / 9 g  Choose: Sherbet (1 cup) / 2.5 g  Choose: Nonfat frozen yogurt (1 cup) / 0.3 g  Choose: Frozen fruit bar / trace  Avoid: Whipped cream (1 tbs) / 3.5 g  Choose: Nondairy whipped topping (1 tbs) / 1 g Condiments / Saturated Fat (g)  Avoid: Mayonnaise (1 tbs) / 2 g  Choose: Low-fat mayonnaise (1 tbs) / 1 g  Avoid: Butter (1 tbs) / 7 g  Choose: Extra light margarine (1 tbs) / 1 g  Avoid: Coconut oil (1 tbs) / 11.8 g  Choose: Olive oil (1 tbs) / 1.8 g  Choose: Corn oil (1 tbs) / 1.7 g  Choose: Safflower oil (1 tbs) / 1.2 g  Choose: Sunflower oil (1 tbs) / 1.4 g  Choose: Soybean oil (1 tbs) / 2.4 g  Choose: Canola oil (1 tbs) / 1 g Document Released: 03/23/2005 Document Revised: 07/18/2012 Document Reviewed: 06/21/2013 [employer] Patient Information 2015 [employer]. This information is not intended to replace advice given to you by your health care provider. Make sure you discuss any questions you have with your health care provider.  
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
## 2014-12-13 DIAGNOSIS — B0229 Other postherpetic nervous system involvement: Secondary | ICD-10-CM | POA: Diagnosis not present

## 2014-12-13 DIAGNOSIS — S069X9A Unspecified intracranial injury with loss of consciousness of unspecified duration, initial encounter: Secondary | ICD-10-CM | POA: Diagnosis not present

## 2014-12-13 LAB — CBC WITH DIFFERENTIAL
BASOS: 1 %
Basophils Absolute: 0 10*3/uL (ref 0.0–0.2)
EOS (ABSOLUTE): 0.1 10*3/uL (ref 0.0–0.4)
EOS: 2 %
Hematocrit: 46.2 % (ref 34.0–46.6)
Hemoglobin: 14.9 g/dL (ref 11.1–15.9)
IMMATURE GRANS (ABS): 0 10*3/uL (ref 0.0–0.1)
Immature Granulocytes: 0 %
LYMPHS: 33 %
Lymphocytes Absolute: 2.2 10*3/uL (ref 0.7–3.1)
MCH: 30 pg (ref 26.6–33.0)
MCHC: 32.3 g/dL (ref 31.5–35.7)
MCV: 93 fL (ref 79–97)
Monocytes Absolute: 0.5 10*3/uL (ref 0.1–0.9)
Monocytes: 7 %
Neutrophils Absolute: 3.7 10*3/uL (ref 1.4–7.0)
Neutrophils: 57 %
RBC: 4.97 x10E6/uL (ref 3.77–5.28)
RDW: 13.8 % (ref 12.3–15.4)
WBC: 6.5 10*3/uL (ref 3.4–10.8)

## 2014-12-13 LAB — LIPID PANEL
CHOL/HDL RATIO: 5.1 ratio — AB (ref 0.0–4.4)
Cholesterol, Total: 229 mg/dL — ABNORMAL HIGH (ref 100–199)
HDL: 45 mg/dL (ref >39)
LDL CALC: 160 mg/dL — AB (ref 0–99)
Triglycerides: 122 mg/dL (ref 0–149)
VLDL CHOLESTEROL CAL: 24 mg/dL (ref 5–40)

## 2014-12-13 LAB — CMP14+EGFR
ALT: 72 IU/L — ABNORMAL HIGH (ref 0–32)
AST: 48 IU/L — ABNORMAL HIGH (ref 0–40)
Albumin/Globulin Ratio: 1.6 (ref 1.1–2.5)
Albumin: 4.6 g/dL (ref 3.5–5.5)
Alkaline Phosphatase: 84 IU/L (ref 39–117)
BUN/Creatinine Ratio: 22 (ref 9–23)
BUN: 17 mg/dL (ref 6–24)
Bilirubin Total: 1.2 mg/dL (ref 0.0–1.2)
CALCIUM: 10 mg/dL (ref 8.7–10.2)
CO2: 24 mmol/L (ref 18–29)
CREATININE: 0.76 mg/dL (ref 0.57–1.00)
Chloride: 98 mmol/L (ref 97–108)
GFR, EST AFRICAN AMERICAN: 101 mL/min/{1.73_m2} (ref >59)
GFR, EST NON AFRICAN AMERICAN: 87 mL/min/{1.73_m2} (ref >59)
GLUCOSE: 70 mg/dL (ref 65–99)
Globulin, Total: 2.8 g/dL (ref 1.5–4.5)
POTASSIUM: 5.5 mmol/L — AB (ref 3.5–5.2)
Sodium: 144 mmol/L (ref 134–144)
TOTAL PROTEIN: 7.4 g/dL (ref 6.0–8.5)

## 2014-12-19 ENCOUNTER — Ambulatory Visit
Admission: RE | Admit: 2014-12-19 | Discharge: 2014-12-19 | Disposition: A | Discharge status: Home / Self Care | Payer: Medicare Other | Source: Ambulatory Visit

## 2014-12-19 DIAGNOSIS — Z1231 Encounter for screening mammogram for malignant neoplasm of breast: Secondary | ICD-10-CM | POA: Diagnosis not present

## 2015-02-05 DIAGNOSIS — H04123 Dry eye syndrome of bilateral lacrimal glands: Secondary | ICD-10-CM | POA: Diagnosis not present

## 2015-02-19 DIAGNOSIS — B078 Other viral warts: Secondary | ICD-10-CM | POA: Diagnosis not present

## 2015-02-19 DIAGNOSIS — L219 Seborrheic dermatitis, unspecified: Secondary | ICD-10-CM | POA: Diagnosis not present

## 2015-02-19 DIAGNOSIS — D225 Melanocytic nevi of trunk: Secondary | ICD-10-CM | POA: Diagnosis not present

## 2015-04-05 DIAGNOSIS — J019 Acute sinusitis, unspecified: Secondary | ICD-10-CM | POA: Diagnosis not present

## 2015-06-04 ENCOUNTER — Telehealth: Payer: Self-pay | Admitting: Family Medicine

## 2015-06-04 DIAGNOSIS — L259 Unspecified contact dermatitis, unspecified cause: Secondary | ICD-10-CM | POA: Diagnosis not present

## 2015-06-04 DIAGNOSIS — L219 Seborrheic dermatitis, unspecified: Secondary | ICD-10-CM | POA: Diagnosis not present

## 2015-06-04 NOTE — Telephone Encounter (Signed)
DENIED 

## 2015-06-18 DIAGNOSIS — S069X9A Unspecified intracranial injury with loss of consciousness of unspecified duration, initial encounter: Secondary | ICD-10-CM | POA: Diagnosis not present

## 2015-06-18 DIAGNOSIS — B0229 Other postherpetic nervous system involvement: Secondary | ICD-10-CM | POA: Diagnosis not present

## 2015-09-24 DIAGNOSIS — B078 Other viral warts: Secondary | ICD-10-CM | POA: Diagnosis not present

## 2015-11-11 ENCOUNTER — Other Ambulatory Visit: Payer: Self-pay | Admitting: Family Medicine

## 2015-11-11 DIAGNOSIS — Z1231 Encounter for screening mammogram for malignant neoplasm of breast: Secondary | ICD-10-CM

## 2015-12-13 ENCOUNTER — Ambulatory Visit (INDEPENDENT_AMBULATORY_CARE_PROVIDER_SITE_OTHER): Payer: Medicare Other

## 2015-12-13 ENCOUNTER — Encounter: Payer: Self-pay | Admitting: Family Medicine

## 2015-12-13 ENCOUNTER — Ambulatory Visit (INDEPENDENT_AMBULATORY_CARE_PROVIDER_SITE_OTHER): Payer: Medicare Other | Admitting: Family Medicine

## 2015-12-13 VITALS — BP 129/80 | HR 69 | Temp 97.8°F | Ht 64.0 in | Wt 161.1 lb

## 2015-12-13 DIAGNOSIS — Z6827 Body mass index (BMI) 27.0-27.9, adult: Secondary | ICD-10-CM

## 2015-12-13 DIAGNOSIS — Z0000 Encounter for general adult medical examination without abnormal findings: Secondary | ICD-10-CM | POA: Diagnosis not present

## 2015-12-13 DIAGNOSIS — Z780 Asymptomatic menopausal state: Secondary | ICD-10-CM

## 2015-12-13 DIAGNOSIS — Z0001 Encounter for general adult medical examination with abnormal findings: Secondary | ICD-10-CM

## 2015-12-13 DIAGNOSIS — Z136 Encounter for screening for cardiovascular disorders: Secondary | ICD-10-CM | POA: Diagnosis not present

## 2015-12-13 DIAGNOSIS — IMO0001 Reserved for inherently not codable concepts without codable children: Secondary | ICD-10-CM
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
## 2015-12-16 ENCOUNTER — Other Ambulatory Visit: Payer: Self-pay

## 2015-12-16 ENCOUNTER — Other Ambulatory Visit: Payer: Medicare Other

## 2015-12-16 ENCOUNTER — Telehealth: Payer: Self-pay | Admitting: Family Medicine

## 2015-12-16 DIAGNOSIS — E875 Hyperkalemia: Secondary | ICD-10-CM

## 2015-12-16 NOTE — Telephone Encounter (Signed)
Patient notified of elevated potassium. Will come back in today for a redraw. Orders placed

## 2015-12-17 ENCOUNTER — Other Ambulatory Visit: Payer: Medicare Other

## 2015-12-17 ENCOUNTER — Telehealth: Payer: Self-pay | Admitting: *Deleted

## 2015-12-17 DIAGNOSIS — S069X9A Unspecified intracranial injury with loss of consciousness of unspecified duration, initial encounter: Secondary | ICD-10-CM | POA: Diagnosis not present

## 2015-12-17 DIAGNOSIS — E875 Hyperkalemia: Secondary | ICD-10-CM

## 2015-12-17 DIAGNOSIS — Z139 Encounter for screening, unspecified: Secondary | ICD-10-CM | POA: Diagnosis not present

## 2015-12-17 DIAGNOSIS — B0229 Other postherpetic nervous system involvement: Secondary | ICD-10-CM | POA: Diagnosis not present

## 2015-12-17 LAB — BMP8+EGFR
BUN / CREAT RATIO: 30 — AB (ref 9–23)
BUN: 22 mg/dL (ref 6–24)
CHLORIDE: 102 mmol/L (ref 96–106)
CO2: 22 mmol/L (ref 18–29)
Calcium: 9.3 mg/dL (ref 8.7–10.2)
Creatinine, Ser: 0.74 mg/dL (ref 0.57–1.00)
GFR calc non Af Amer: 90 mL/min/{1.73_m2} (ref >59)
GFR, EST AFRICAN AMERICAN: 103 mL/min/{1.73_m2} (ref >59)
GLUCOSE: 112 mg/dL — AB (ref 65–99)
Potassium: 4.4 mmol/L (ref 3.5–5.2)
SODIUM: 143 mmol/L (ref 134–144)

## 2015-12-17 NOTE — Telephone Encounter (Signed)
-----   Message from [name], MD sent at 12/16/2015 11:36 AM EDT ----- Cholesterol rather high. Try a low fat diet at 30 min of active exercice 5 times  A week.  Potassium high. Have her come in for BMP, ASAP.  HIV needs to be drawn due to insufficient blood at previous draw to complete.

## 2015-12-17 NOTE — Telephone Encounter (Signed)
Pt notified of results Verbalizes understanding Will come back for repeat K+ Order entered in epic

## 2015-12-18 ENCOUNTER — Other Ambulatory Visit: Payer: Self-pay | Admitting: Family Medicine

## 2015-12-18 ENCOUNTER — Encounter: Payer: Self-pay | Admitting: Family Medicine

## 2015-12-18 ENCOUNTER — Other Ambulatory Visit: Payer: Medicare Other

## 2015-12-18 ENCOUNTER — Other Ambulatory Visit: Payer: Self-pay | Admitting: *Deleted

## 2015-12-18 DIAGNOSIS — R739 Hyperglycemia, unspecified: Secondary | ICD-10-CM | POA: Insufficient documentation

## 2015-12-18 DIAGNOSIS — E162 Hypoglycemia, unspecified: Secondary | ICD-10-CM | POA: Diagnosis not present

## 2015-12-18 LAB — BAYER DCA HB A1C WAIVED: HB A1C (BAYER DCA - WAIVED): 6 % (ref <7.0)

## 2015-12-18 LAB — BMP8+EGFR
BUN / CREAT RATIO: 25 — AB (ref 9–23)
BUN: 17 mg/dL (ref 6–24)
CHLORIDE: 101 mmol/L (ref 96–106)
CO2: 23 mmol/L (ref 18–29)
Calcium: 9.2 mg/dL (ref 8.7–10.2)
Creatinine, Ser: 0.68 mg/dL (ref 0.57–1.00)
GFR calc non Af Amer: 97 mL/min/{1.73_m2} (ref >59)
GFR, EST AFRICAN AMERICAN: 112 mL/min/{1.73_m2} (ref >59)
GLUCOSE: 150 mg/dL — AB (ref 65–99)
Potassium: 4.1 mmol/L (ref 3.5–5.2)
SODIUM: 142 mmol/L (ref 134–144)

## 2015-12-18 LAB — HIV ANTIBODY (ROUTINE TESTING W REFLEX): HIV Screen 4th Generation wRfx: NONREACTIVE

## 2015-12-18 LAB — HEPATITIS C ANTIBODY: Hep C Virus Ab: <0.1 s/co ratio (ref 0.0–0.9)

## 2015-12-18 MED ORDER — METFORMIN HCL ER 500 MG PO TB24: 500.0000 mg | ORAL_TABLET | Freq: Every day | ORAL | 2 refills | Status: DC

## 2015-12-18 NOTE — Patient Instructions (Signed)

## 2015-12-19 LAB — LIPID PANEL
CHOLESTEROL TOTAL: 212 mg/dL — AB (ref 100–199)
Chol/HDL Ratio: 5 ratio units — ABNORMAL HIGH (ref 0.0–4.4)
HDL: 42 mg/dL (ref >39)
LDL CALC: 140 mg/dL — AB (ref 0–99)
Triglycerides: 151 mg/dL — ABNORMAL HIGH (ref 0–149)
VLDL CHOLESTEROL CAL: 30 mg/dL (ref 5–40)

## 2015-12-19 LAB — CBC WITH DIFFERENTIAL/PLATELET
BASOS: 1 %
Basophils Absolute: 0.1 10*3/uL (ref 0.0–0.2)
EOS (ABSOLUTE): 0.2 10*3/uL (ref 0.0–0.4)
EOS: 2 %
HEMATOCRIT: 44.2 % (ref 34.0–46.6)
HEMOGLOBIN: 14.8 g/dL (ref 11.1–15.9)
Immature Grans (Abs): 0 10*3/uL (ref 0.0–0.1)
Immature Granulocytes: 0 %
LYMPHS ABS: 2.1 10*3/uL (ref 0.7–3.1)
Lymphs: 31 %
MCH: 29.1 pg (ref 26.6–33.0)
MCHC: 33.5 g/dL (ref 31.5–35.7)
MCV: 87 fL (ref 79–97)
MONOCYTES: 8 %
Monocytes Absolute: 0.5 10*3/uL (ref 0.1–0.9)
NEUTROS ABS: 3.8 10*3/uL (ref 1.4–7.0)
Neutrophils: 58 %
Platelets: 312 10*3/uL (ref 150–379)
RBC: 5.09 x10E6/uL (ref 3.77–5.28)
RDW: 13.8 % (ref 12.3–15.4)
WBC: 6.6 10*3/uL (ref 3.4–10.8)

## 2015-12-19 LAB — CMP14+EGFR
A/G RATIO: 1.6 (ref 1.2–2.2)
ALBUMIN: 4.8 g/dL (ref 3.5–5.5)
ALK PHOS: 83 IU/L (ref 39–117)
ALT: 55 IU/L — ABNORMAL HIGH (ref 0–32)
AST: 51 IU/L — ABNORMAL HIGH (ref 0–40)
BUN / CREAT RATIO: 24 — AB (ref 9–23)
BUN: 18 mg/dL (ref 6–24)
Bilirubin Total: 1 mg/dL (ref 0.0–1.2)
CO2: 19 mmol/L (ref 18–29)
CREATININE: 0.74 mg/dL (ref 0.57–1.00)
Calcium: 9.8 mg/dL (ref 8.7–10.2)
Chloride: 102 mmol/L (ref 96–106)
GFR calc Af Amer: 103 mL/min/{1.73_m2} (ref >59)
GFR, EST NON AFRICAN AMERICAN: 90 mL/min/{1.73_m2} (ref >59)
GLOBULIN, TOTAL: 3 g/dL (ref 1.5–4.5)
Glucose: 89 mg/dL (ref 65–99)
POTASSIUM: 6.6 mmol/L — AB (ref 3.5–5.2)
SODIUM: 143 mmol/L (ref 134–144)
Total Protein: 7.8 g/dL (ref 6.0–8.5)

## 2015-12-19 LAB — HEPATITIS C ANTIBODY

## 2015-12-19 LAB — VITAMIN D 25 HYDROXY (VIT D DEFICIENCY, FRACTURES)

## 2015-12-19 LAB — HIV ANTIBODY (ROUTINE TESTING W REFLEX)

## 2015-12-20 ENCOUNTER — Telehealth: Payer: Self-pay | Admitting: Family Medicine

## 2015-12-20 NOTE — Telephone Encounter (Signed)
Patient aware.

## 2015-12-20 NOTE — Telephone Encounter (Signed)
Please contact the patient. That would be fine.

## 2015-12-23 ENCOUNTER — Ambulatory Visit: Payer: Medicare Other

## 2015-12-26 ENCOUNTER — Other Ambulatory Visit: Payer: Medicare Other

## 2015-12-26 DIAGNOSIS — R739 Hyperglycemia, unspecified: Secondary | ICD-10-CM

## 2015-12-26 LAB — GLUCOSE HEMOCUE WAIVED: Glu Hemocue Waived: 92 mg/dL (ref 65–99)

## 2016-01-15 ENCOUNTER — Encounter: Payer: Self-pay | Admitting: Pharmacist

## 2016-01-15 DIAGNOSIS — M8580 Other specified disorders of bone density and structure, unspecified site: Secondary | ICD-10-CM | POA: Insufficient documentation

## 2016-01-20 ENCOUNTER — Encounter: Payer: Self-pay | Admitting: Pharmacist

## 2016-01-20 DIAGNOSIS — M8589 Other specified disorders of bone density and structure, multiple sites: Secondary | ICD-10-CM

## 2016-01-20 MED ORDER — CALCIUM CARBONATE 600 MG PO TABS: 600.0000 mg | ORAL_TABLET | Freq: Two times a day (BID) | ORAL | Status: DC

## 2016-01-20 NOTE — Progress Notes (Signed)
Patient ID: Sheila Patterson, female   DOB: 07-25-1955, 60 y.o.   MRN: [id number]      HPI: Patient came in with her mother for anticoagualtion visit.  She has received a call to schedule appt to reviewe DEXA results but she was unable to get through on phone so wanted to discuss today.  She has a diagnosis of osteopenia from 2014.  She use to take calcium in past but has not taken in over 1 years.  Back Pain?  No       Kyphosis?  No Prior fracture?  No Med(s) for Osteoporosis/Osteopenia:  none Med(s) previously tried for Osteoporosis/Osteopenia:  none                                                             PMH: Age at menopause:  79's Hysterectomy?  Yes Oophorectomy?  Yes HRT? No Steroid Use?  No Thyroid med?  No History of cancer?  Yes - breast cancer  History of digestive disorders (ie Crohn's)?  No Current or previous eating disorders?  No Last Vitamin D Result:  Ordered 12/2015 but not enough blood to complete test Last GFR Result:  97 (12/17/2015)   FH/SH: Family history of osteoporosis?  No Parent with history of hip fracture?  No Family history of breast cancer?  No Exercise?  No Smoking?  No Alcohol?  No    Calcium Assessment Calcium Intake  # of servings/day  Calcium mg  Milk (8 oz) 0  x  300  = 0  Yogurt (4 oz) 0 x  200 = 0  Cheese (1 oz) 1 x  200 = 200mg   Other Calcium sources   250mg   Ca supplement 0 = 0   Estimated calcium intake per day 450mg     DEXA Results Date of Test T-Score for AP Spine L1-L4 T-Score for Neck Left Hip  01/14/2016 -1.7 -1.8  09/01/2012 (done at [facility]) -1.7 -1.8           FRAX 10 year estimate: Total FX risk:  7.5%  (consider medication if >/= 20%) Hip FX risk:  0.7%  (consider medication if >/= 3%)  Assessment: Osteopenia - stable with low fracture risk  Recommendations: 1.   Discussed BMD  / DEXA results and discussed fracture risk. 2.  recommend calcium 1200mg  daily through supplementation or diet.  3.   recommend weight bearing exercise - 30 minutes at least 4 days per week.   4.  Counseled and educated about fall risk and prevention. 5.  Patient to RTC to have vitamin D level checked - has trouble with blood draws so she will return on day when she can hydrate well.  Recheck DEXA:  2 years  Time spent counseling patient:  15 minutes

## 2016-01-22 DIAGNOSIS — L218 Other seborrheic dermatitis: Secondary | ICD-10-CM | POA: Diagnosis not present

## 2016-01-22 DIAGNOSIS — H61009 Unspecified perichondritis of external ear, unspecified ear: Secondary | ICD-10-CM | POA: Diagnosis not present

## 2016-03-17 DIAGNOSIS — H61031 Chondritis of right external ear: Secondary | ICD-10-CM | POA: Diagnosis not present

## 2016-05-20 ENCOUNTER — Telehealth: Payer: Self-pay | Admitting: Pharmacist

## 2016-05-20 NOTE — Telephone Encounter (Signed)
Patient called and reminded to have Vitamin D checked.  She will have done next time she bring her mother in for protime in 2-3 week.s

## 2016-05-27 ENCOUNTER — Other Ambulatory Visit: Payer: Medicare Other

## 2016-05-27 DIAGNOSIS — M8589 Other specified disorders of bone density and structure, multiple sites: Secondary | ICD-10-CM

## 2016-05-27 DIAGNOSIS — M859 Disorder of bone density and structure, unspecified: Secondary | ICD-10-CM | POA: Diagnosis not present

## 2016-05-28 LAB — VITAMIN D 25 HYDROXY (VIT D DEFICIENCY, FRACTURES): VIT D 25 HYDROXY: 23.3 ng/mL — AB (ref 30.0–100.0)

## 2016-05-29 ENCOUNTER — Other Ambulatory Visit: Payer: Self-pay | Admitting: Pharmacist

## 2016-05-29 DIAGNOSIS — R7989 Other specified abnormal findings of blood chemistry: Secondary | ICD-10-CM

## 2016-05-29 MED ORDER — VITAMIN D (ERGOCALCIFEROL) 1.25 MG (50000 UNIT) PO CAPS: 50000.0000 [IU] | ORAL_CAPSULE | ORAL | 0 refills | Status: DC

## 2016-07-03 DIAGNOSIS — B0229 Other postherpetic nervous system involvement: Secondary | ICD-10-CM | POA: Diagnosis not present

## 2016-07-03 DIAGNOSIS — S069X9A Unspecified intracranial injury with loss of consciousness of unspecified duration, initial encounter: Secondary | ICD-10-CM | POA: Diagnosis not present

## 2016-07-20 DIAGNOSIS — Z0000 Encounter for general adult medical examination without abnormal findings: Secondary | ICD-10-CM | POA: Diagnosis not present

## 2016-07-20 DIAGNOSIS — H61021 Chronic perichondritis of right external ear: Secondary | ICD-10-CM | POA: Diagnosis not present

## 2016-08-13 DIAGNOSIS — H61021 Chronic perichondritis of right external ear: Secondary | ICD-10-CM | POA: Diagnosis not present

## 2016-08-13 DIAGNOSIS — H6123 Impacted cerumen, bilateral: Secondary | ICD-10-CM | POA: Diagnosis not present

## 2016-08-18 ENCOUNTER — Telehealth: Payer: Self-pay | Admitting: Family Medicine

## 2016-08-18 NOTE — Telephone Encounter (Signed)
Do you know anything about this? 

## 2016-08-18 NOTE — Telephone Encounter (Signed)
fininshed  Patient called to pick up

## 2016-08-18 NOTE — Telephone Encounter (Signed)
It was picked up from my outbox earlier today.

## 2016-09-23 DIAGNOSIS — H6123 Impacted cerumen, bilateral: Secondary | ICD-10-CM | POA: Diagnosis not present

## 2016-09-23 DIAGNOSIS — H61021 Chronic perichondritis of right external ear: Secondary | ICD-10-CM | POA: Diagnosis not present

## 2016-12-09 DIAGNOSIS — S0990XS Unspecified injury of head, sequela: Secondary | ICD-10-CM | POA: Diagnosis not present

## 2016-12-16 DIAGNOSIS — H6123 Impacted cerumen, bilateral: Secondary | ICD-10-CM | POA: Diagnosis not present

## 2016-12-16 DIAGNOSIS — H61021 Chronic perichondritis of right external ear: Secondary | ICD-10-CM | POA: Diagnosis not present

## 2016-12-29 ENCOUNTER — Other Ambulatory Visit: Payer: Self-pay | Admitting: Otolaryngology

## 2016-12-29 DIAGNOSIS — D2321 Other benign neoplasm of skin of right ear and external auricular canal: Secondary | ICD-10-CM | POA: Diagnosis not present

## 2016-12-29 DIAGNOSIS — L989 Disorder of the skin and subcutaneous tissue, unspecified: Secondary | ICD-10-CM | POA: Diagnosis not present

## 2016-12-29 DIAGNOSIS — H61021 Chronic perichondritis of right external ear: Secondary | ICD-10-CM | POA: Diagnosis not present

## 2016-12-29 DIAGNOSIS — H61001 Unspecified perichondritis of right external ear: Secondary | ICD-10-CM | POA: Diagnosis not present

## 2017-01-26 DIAGNOSIS — H6123 Impacted cerumen, bilateral: Secondary | ICD-10-CM | POA: Diagnosis not present

## 2017-01-26 DIAGNOSIS — H60393 Other infective otitis externa, bilateral: Secondary | ICD-10-CM | POA: Diagnosis not present

## 2017-01-26 DIAGNOSIS — H61021 Chronic perichondritis of right external ear: Secondary | ICD-10-CM | POA: Diagnosis not present

## 2017-01-26 DIAGNOSIS — D2321 Other benign neoplasm of skin of right ear and external auricular canal: Secondary | ICD-10-CM | POA: Diagnosis not present

## 2017-01-28 DIAGNOSIS — H61021 Chronic perichondritis of right external ear: Secondary | ICD-10-CM | POA: Diagnosis not present

## 2017-01-28 DIAGNOSIS — D2321 Other benign neoplasm of skin of right ear and external auricular canal: Secondary | ICD-10-CM | POA: Diagnosis not present

## 2017-01-28 DIAGNOSIS — H60393 Other infective otitis externa, bilateral: Secondary | ICD-10-CM | POA: Diagnosis not present

## 2017-03-19 DIAGNOSIS — S0990XD Unspecified injury of head, subsequent encounter: Secondary | ICD-10-CM | POA: Diagnosis not present

## 2017-04-05 DIAGNOSIS — H61021 Chronic perichondritis of right external ear: Secondary | ICD-10-CM | POA: Diagnosis not present

## 2017-04-05 DIAGNOSIS — D2321 Other benign neoplasm of skin of right ear and external auricular canal: Secondary | ICD-10-CM | POA: Diagnosis not present

## 2017-05-10 DIAGNOSIS — H61021 Chronic perichondritis of right external ear: Secondary | ICD-10-CM | POA: Diagnosis not present

## 2017-05-10 DIAGNOSIS — D2321 Other benign neoplasm of skin of right ear and external auricular canal: Secondary | ICD-10-CM | POA: Diagnosis not present

## 2017-05-19 ENCOUNTER — Encounter: Payer: Self-pay | Admitting: Family Medicine

## 2017-05-19 ENCOUNTER — Ambulatory Visit (INDEPENDENT_AMBULATORY_CARE_PROVIDER_SITE_OTHER): Payer: Medicare Other | Admitting: Family Medicine

## 2017-05-19 VITALS — BP 131/81 | HR 83 | Temp 98.3°F | Ht 64.0 in | Wt 166.0 lb

## 2017-05-19 DIAGNOSIS — H61001 Unspecified perichondritis of right external ear: Secondary | ICD-10-CM | POA: Diagnosis not present

## 2017-05-19 NOTE — Progress Notes (Signed)
Chief Complaint  Patient presents with  . Ear Problem    pt here today to get referral to ENT for a second opinion     HPI  Patient presents today for concerns regarding an ear lesion.  She had a biopsy at the superior aspect of the right auricle.  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  The patient noted that there was still some present after the surgery and her ENT wants to re-excise it.  The patient is interested in a second opinion to determine if it is cancerous and if re excision is appropriate.  She says that it is sore when she lays on it or touches it.  PMH: Smoking status noted ROS: Per HPI  Objective: BP 131/81   Pulse 83   Temp 98.3 F (36.8 C) (Oral)   Ht 5\' 4"  (1.626 m)   Wt 166 lb (75.3 kg)   BMI 28.49 kg/m  Gen: NAD, alert, cooperative with exam HEENT: NCAT, EOMI, PERRL.  There is a 2 mm x 3 mm rough nodular eruption at the top of the right oral helix CV: RRR, good S1/S2, no murmur Resp: CTABL, no wheezes, non-labored  Neuro: Alert and oriented, No gross deficits  Assessment and plan:  1. Chondrodermatitis nodularis helicis of right ear       Orders Placed This Encounter  Procedures  . Ambulatory referral to ENT    Referral Priority:   Routine    Referral Type:   Consultation    Referral Reason:   Specialty Services Required    Requested Specialty:   Otolaryngology    Number of Visits Requested:   1    Follow up soon for complete physical exam [name], MD

## 2017-06-22 DIAGNOSIS — D485 Neoplasm of uncertain behavior of skin: Secondary | ICD-10-CM | POA: Diagnosis not present

## 2017-07-07 ENCOUNTER — Encounter: Payer: Medicare Other | Admitting: Family Medicine

## 2017-08-12 DIAGNOSIS — S0990XD Unspecified injury of head, subsequent encounter: Secondary | ICD-10-CM | POA: Diagnosis not present

## 2017-08-26 ENCOUNTER — Telehealth: Payer: Self-pay | Admitting: Family Medicine

## 2017-08-26 NOTE — Telephone Encounter (Signed)
Pt denied did not want to schedule

## 2017-10-19 ENCOUNTER — Encounter: Payer: Self-pay | Admitting: Family Medicine

## 2017-10-19 ENCOUNTER — Ambulatory Visit (INDEPENDENT_AMBULATORY_CARE_PROVIDER_SITE_OTHER): Payer: Medicare Other | Admitting: Family Medicine

## 2017-10-19 VITALS — BP 130/82 | HR 70 | Temp 97.6°F | Ht 64.0 in | Wt 160.4 lb

## 2017-10-19 DIAGNOSIS — I609 Nontraumatic subarachnoid hemorrhage, unspecified: Secondary | ICD-10-CM

## 2017-10-19 DIAGNOSIS — R739 Hyperglycemia, unspecified: Secondary | ICD-10-CM

## 2017-10-19 DIAGNOSIS — Z13220 Encounter for screening for lipoid disorders: Secondary | ICD-10-CM | POA: Diagnosis not present

## 2017-10-19 DIAGNOSIS — R7989 Other specified abnormal findings of blood chemistry: Secondary | ICD-10-CM | POA: Diagnosis not present

## 2017-10-19 LAB — BAYER DCA HB A1C WAIVED: HB A1C: 5.7 % (ref <7.0)

## 2017-10-19 NOTE — Patient Instructions (Signed)

## 2017-10-20 LAB — LIPID PANEL
Chol/HDL Ratio: 5.3 ratio — ABNORMAL HIGH (ref 0.0–4.4)
Cholesterol, Total: 212 mg/dL — ABNORMAL HIGH (ref 100–199)
HDL: 40 mg/dL (ref >39)
LDL Calculated: 136 mg/dL — ABNORMAL HIGH (ref 0–99)
Triglycerides: 179 mg/dL — ABNORMAL HIGH (ref 0–149)
VLDL CHOLESTEROL CAL: 36 mg/dL (ref 5–40)

## 2017-10-20 LAB — CBC WITH DIFFERENTIAL/PLATELET
BASOS: 1 %
Basophils Absolute: 0.1 10*3/uL (ref 0.0–0.2)
EOS (ABSOLUTE): 0.1 10*3/uL (ref 0.0–0.4)
EOS: 1 %
Hematocrit: 44.1 % (ref 34.0–46.6)
Hemoglobin: 14.3 g/dL (ref 11.1–15.9)
IMMATURE GRANS (ABS): 0 10*3/uL (ref 0.0–0.1)
Immature Granulocytes: 0 %
Lymphocytes Absolute: 2 10*3/uL (ref 0.7–3.1)
Lymphs: 24 %
MCH: 29.5 pg (ref 26.6–33.0)
MCHC: 32.4 g/dL (ref 31.5–35.7)
MCV: 91 fL (ref 79–97)
MONOS ABS: 0.5 10*3/uL (ref 0.1–0.9)
Monocytes: 5 %
NEUTROS PCT: 69 %
Neutrophils Absolute: 5.8 10*3/uL (ref 1.4–7.0)
Platelets: 319 10*3/uL (ref 150–450)
RBC: 4.85 x10E6/uL (ref 3.77–5.28)
RDW: 13.7 % (ref 12.3–15.4)
WBC: 8.4 10*3/uL (ref 3.4–10.8)

## 2017-10-20 LAB — CMP14+EGFR
A/G RATIO: 1.7 (ref 1.2–2.2)
ALT: 32 IU/L (ref 0–32)
AST: 29 IU/L (ref 0–40)
Albumin: 4.7 g/dL (ref 3.6–4.8)
Alkaline Phosphatase: 80 IU/L (ref 39–117)
BILIRUBIN TOTAL: 0.8 mg/dL (ref 0.0–1.2)
BUN / CREAT RATIO: 23 (ref 12–28)
BUN: 16 mg/dL (ref 8–27)
CHLORIDE: 104 mmol/L (ref 96–106)
CO2: 23 mmol/L (ref 20–29)
Calcium: 9.8 mg/dL (ref 8.7–10.3)
Creatinine, Ser: 0.71 mg/dL (ref 0.57–1.00)
GFR calc non Af Amer: 93 mL/min/{1.73_m2} (ref >59)
GFR, EST AFRICAN AMERICAN: 107 mL/min/{1.73_m2} (ref >59)
GLUCOSE: 86 mg/dL (ref 65–99)
Globulin, Total: 2.7 g/dL (ref 1.5–4.5)
Potassium: 5.1 mmol/L (ref 3.5–5.2)
Sodium: 143 mmol/L (ref 134–144)
TOTAL PROTEIN: 7.4 g/dL (ref 6.0–8.5)

## 2017-10-23 ENCOUNTER — Encounter: Payer: Self-pay | Admitting: Family Medicine
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
## 2017-11-22 ENCOUNTER — Ambulatory Visit (INDEPENDENT_AMBULATORY_CARE_PROVIDER_SITE_OTHER): Payer: Medicare Other

## 2017-11-22 ENCOUNTER — Encounter: Payer: Self-pay | Admitting: Physician Assistant

## 2017-11-22 ENCOUNTER — Ambulatory Visit (INDEPENDENT_AMBULATORY_CARE_PROVIDER_SITE_OTHER): Payer: Medicare Other | Admitting: Physician Assistant

## 2017-11-22 VITALS — BP 127/78 | HR 71 | Temp 98.5°F | Ht 64.0 in | Wt 162.6 lb

## 2017-11-22 DIAGNOSIS — S40022A Contusion of left upper arm, initial encounter: Secondary | ICD-10-CM

## 2017-11-22 DIAGNOSIS — S8992XA Unspecified injury of left lower leg, initial encounter: Secondary | ICD-10-CM | POA: Diagnosis not present

## 2017-11-22 MED ORDER — PREDNISONE 10 MG (21) PO TBPK: ORAL_TABLET | ORAL | 0 refills | Status: DC
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
## 2017-12-07 DIAGNOSIS — S30861A Insect bite (nonvenomous) of abdominal wall, initial encounter: Secondary | ICD-10-CM | POA: Diagnosis not present

## 2017-12-07 DIAGNOSIS — L720 Epidermal cyst: Secondary | ICD-10-CM | POA: Diagnosis not present

## 2017-12-07 DIAGNOSIS — B078 Other viral warts: Secondary | ICD-10-CM | POA: Diagnosis not present

## 2017-12-07 DIAGNOSIS — Z1283 Encounter for screening for malignant neoplasm of skin: Secondary | ICD-10-CM | POA: Diagnosis not present

## 2018-01-17 DIAGNOSIS — L218 Other seborrheic dermatitis: Secondary | ICD-10-CM | POA: Diagnosis not present

## 2018-01-17 DIAGNOSIS — L820 Inflamed seborrheic keratosis: Secondary | ICD-10-CM | POA: Diagnosis not present

## 2018-02-16 DIAGNOSIS — D2321 Other benign neoplasm of skin of right ear and external auricular canal: Secondary | ICD-10-CM | POA: Diagnosis not present

## 2018-02-16 DIAGNOSIS — H60393 Other infective otitis externa, bilateral: Secondary | ICD-10-CM | POA: Diagnosis not present

## 2018-02-16 DIAGNOSIS — H61021 Chronic perichondritis of right external ear: Secondary | ICD-10-CM | POA: Diagnosis not present

## 2018-02-22 DIAGNOSIS — R51 Headache: Secondary | ICD-10-CM | POA: Diagnosis not present

## 2018-02-22 DIAGNOSIS — Z87820 Personal history of traumatic brain injury: Secondary | ICD-10-CM | POA: Diagnosis not present

## 2018-03-02 DIAGNOSIS — Z1231 Encounter for screening mammogram for malignant neoplasm of breast: Secondary | ICD-10-CM | POA: Diagnosis not present

## 2018-03-17 DIAGNOSIS — H5203 Hypermetropia, bilateral: Secondary | ICD-10-CM | POA: Diagnosis not present

## 2018-03-17 DIAGNOSIS — H2513 Age-related nuclear cataract, bilateral: Secondary | ICD-10-CM | POA: Diagnosis not present

## 2018-03-17 DIAGNOSIS — H52203 Unspecified astigmatism, bilateral: Secondary | ICD-10-CM | POA: Diagnosis not present

## 2018-03-17 DIAGNOSIS — H524 Presbyopia: Secondary | ICD-10-CM | POA: Diagnosis not present

## 2018-04-22 ENCOUNTER — Ambulatory Visit: Payer: Medicare Other | Admitting: Family Medicine

## 2018-08-26 ENCOUNTER — Telehealth: Payer: Self-pay | Admitting: Family Medicine

## 2018-11-17 ENCOUNTER — Ambulatory Visit (INDEPENDENT_AMBULATORY_CARE_PROVIDER_SITE_OTHER): Payer: Medicare Other | Admitting: Family Medicine

## 2018-11-17 DIAGNOSIS — E782 Mixed hyperlipidemia: Secondary | ICD-10-CM | POA: Diagnosis not present

## 2018-11-17 DIAGNOSIS — M8580 Other specified disorders of bone density and structure, unspecified site: Secondary | ICD-10-CM | POA: Diagnosis not present

## 2018-11-18 ENCOUNTER — Other Ambulatory Visit: Payer: Medicare Other

## 2018-11-18 ENCOUNTER — Other Ambulatory Visit: Payer: Self-pay

## 2018-11-18 DIAGNOSIS — R739 Hyperglycemia, unspecified: Secondary | ICD-10-CM

## 2018-11-18 DIAGNOSIS — Z13220 Encounter for screening for lipoid disorders: Secondary | ICD-10-CM

## 2018-11-18 DIAGNOSIS — R7989 Other specified abnormal findings of blood chemistry: Secondary | ICD-10-CM

## 2018-11-18 LAB — BAYER DCA HB A1C WAIVED: HB A1C (BAYER DCA - WAIVED): 5.8 % (ref <7.0)

## 2018-11-19 LAB — LIPID PANEL
Chol/HDL Ratio: 4.8 ratio — ABNORMAL HIGH (ref 0.0–4.4)
Cholesterol, Total: 236 mg/dL — ABNORMAL HIGH (ref 100–199)
HDL: 49 mg/dL (ref >39)
LDL Calculated: 153 mg/dL — ABNORMAL HIGH (ref 0–99)
Triglycerides: 168 mg/dL — ABNORMAL HIGH (ref 0–149)
VLDL Cholesterol Cal: 34 mg/dL (ref 5–40)

## 2018-11-19 LAB — CBC WITH DIFFERENTIAL/PLATELET
Basophils Absolute: 0.1 10*3/uL (ref 0.0–0.2)
Basos: 1 %
EOS (ABSOLUTE): 0.1 10*3/uL (ref 0.0–0.4)
Eos: 1 %
Hematocrit: 45.9 % (ref 34.0–46.6)
Hemoglobin: 14.7 g/dL (ref 11.1–15.9)
Immature Grans (Abs): 0 10*3/uL (ref 0.0–0.1)
Immature Granulocytes: 0 %
Lymphocytes Absolute: 1.6 10*3/uL (ref 0.7–3.1)
Lymphs: 21 %
MCH: 29.2 pg (ref 26.6–33.0)
MCHC: 32 g/dL (ref 31.5–35.7)
MCV: 91 fL (ref 79–97)
Monocytes Absolute: 0.5 10*3/uL (ref 0.1–0.9)
Monocytes: 6 %
Neutrophils Absolute: 5.5 10*3/uL (ref 1.4–7.0)
Neutrophils: 71 %
Platelets: 382 10*3/uL (ref 150–450)
RBC: 5.04 x10E6/uL (ref 3.77–5.28)
RDW: 12.8 % (ref 11.7–15.4)
WBC: 7.7 10*3/uL (ref 3.4–10.8)

## 2018-11-19 LAB — CMP14+EGFR
ALT: 30 IU/L (ref 0–32)
AST: 26 IU/L (ref 0–40)
Albumin/Globulin Ratio: 2 (ref 1.2–2.2)
Albumin: 4.9 g/dL — ABNORMAL HIGH (ref 3.8–4.8)
Alkaline Phosphatase: 77 IU/L (ref 39–117)
BUN/Creatinine Ratio: 21 (ref 12–28)
BUN: 20 mg/dL (ref 8–27)
Bilirubin Total: 1.2 mg/dL (ref 0.0–1.2)
CO2: 21 mmol/L (ref 20–29)
Calcium: 9.8 mg/dL (ref 8.7–10.3)
Chloride: 103 mmol/L (ref 96–106)
Creatinine, Ser: 0.95 mg/dL (ref 0.57–1.00)
GFR calc Af Amer: 75 mL/min/{1.73_m2} (ref >59)
GFR calc non Af Amer: 65 mL/min/{1.73_m2} (ref >59)
Globulin, Total: 2.4 g/dL (ref 1.5–4.5)
Glucose: 94 mg/dL (ref 65–99)
Potassium: 5.2 mmol/L (ref 3.5–5.2)
Sodium: 142 mmol/L (ref 134–144)
Total Protein: 7.3 g/dL (ref 6.0–8.5)

## 2018-11-19 LAB — VITAMIN D 25 HYDROXY (VIT D DEFICIENCY, FRACTURES): Vit D, 25-Hydroxy: 31.1 ng/mL (ref 30.0–100.0)

## 2018-11-21 ENCOUNTER — Other Ambulatory Visit: Payer: Self-pay | Admitting: Family Medicine

## 2018-11-21 ENCOUNTER — Encounter: Payer: Self-pay | Admitting: Family Medicine

## 2018-11-21 MED ORDER — ATORVASTATIN CALCIUM 40 MG PO TABS: 40.0000 mg | ORAL_TABLET | Freq: Every day | ORAL | 3 refills | Status: DC

## 2018-11-21 NOTE — Progress Notes (Signed)
    Subjective:    Patient ID: Sheila Patterson, female    DOB: 1956-03-11, 63 y.o.   MRN: [id number]   HPI: Sheila Patterson is a 63 y.o. female presenting for  in for follow-up of elevated cholesterol. Doing well without complaints on current medication. Denies side effects of statin including myalgia and arthralgia and nausea. Currently no chest pain, shortness of breath or other cardiovascular related symptoms noted.   Depression screen [facility] 2/9 11/22/2017 05/19/2017 12/13/2015 12/11/2014 06/08/2014  Decreased Interest 0 0 0 0 0  Down, Depressed, Hopeless 0 0 0 0 0  PHQ - 2 Score 0 0 0 0 0     Relevant past medical, surgical, family and social history reviewed and updated as indicated.  Interim medical history since our last visit reviewed. Allergies and medications reviewed and updated.  ROS:  Review of Systems  Constitutional: Negative.   HENT: Negative for congestion.   Eyes: Negative for visual disturbance.  Respiratory: Negative for shortness of breath.   Cardiovascular: Negative for chest pain.  Gastrointestinal: Negative for abdominal pain, constipation, diarrhea, nausea and vomiting.  Genitourinary: Negative for difficulty urinating.  Musculoskeletal: Negative for arthralgias and myalgias.  Neurological: Negative for headaches.  Psychiatric/Behavioral: Negative for sleep disturbance.     Social History   Tobacco Use  Smoking Status Former Smoker  . Quit date: 04/06/1992  . Years since quitting: 26.6  Smokeless Tobacco Never Used       Objective:     Wt Readings from Last 3 Encounters:  11/22/17 162 lb 9.6 oz (73.8 kg)  10/19/17 160 lb 6 oz (72.7 kg)  05/19/17 166 lb (75.3 kg)     Exam deferred. Pt. Harboring due to COVID 19. Phone visit performed.   Assessment & Plan:   1. Osteopenia, unspecified location   2. Mixed hyperlipidemia     No orders of the defined types were placed in this encounter.   No orders of the defined types were placed in this  encounter.     Diagnoses and all orders for this visit:  Osteopenia, unspecified location  Mixed hyperlipidemia    Virtual Visit via telephone Note  I discussed the limitations, risks, security and privacy concerns of performing an evaluation and management service by telephone and the availability of in person appointments. The patient was identified with two identifiers. Pt.expressed understanding and agreed to proceed. Pt. Is at home. Dr. Livia Snellen is in his office.  Follow Up Instructions:   I discussed the assessment and treatment plan with the patient. The patient was provided an opportunity to ask questions and all were answered. The patient agreed with the plan and demonstrated an understanding of the instructions.   The patient was advised to call back or seek an in-person evaluation if the symptoms worsen or if the condition fails to improve as anticipated.   Total minutes including chart review and phone contact time: 18   Follow up plan: Return in about 6 months (around 05/20/2019).  Sheila Fraise, MD [place]

## 2019-03-01 ENCOUNTER — Other Ambulatory Visit: Payer: Self-pay | Admitting: Surgery

## 2019-03-01 DIAGNOSIS — Z853 Personal history of malignant neoplasm of breast: Secondary | ICD-10-CM

## 2019-03-01 DIAGNOSIS — N6320 Unspecified lump in the left breast, unspecified quadrant: Secondary | ICD-10-CM

## 2019-03-08 ENCOUNTER — Ambulatory Visit
Admission: RE | Admit: 2019-03-08 | Discharge: 2019-03-08 | Disposition: A | Discharge status: Home / Self Care | Payer: Medicare Other | Source: Ambulatory Visit | Attending: Surgery | Admitting: Surgery

## 2019-03-08 ENCOUNTER — Other Ambulatory Visit: Payer: Self-pay

## 2019-03-08 ENCOUNTER — Other Ambulatory Visit: Payer: Self-pay | Admitting: Surgery

## 2019-03-08 DIAGNOSIS — N6320 Unspecified lump in the left breast, unspecified quadrant: Secondary | ICD-10-CM

## 2019-03-08 DIAGNOSIS — Z853 Personal history of malignant neoplasm of breast: Secondary | ICD-10-CM

## 2019-03-14 ENCOUNTER — Other Ambulatory Visit: Payer: Self-pay | Admitting: *Deleted

## 2019-03-14 DIAGNOSIS — Z20822 Contact with and (suspected) exposure to covid-19: Secondary | ICD-10-CM

## 2019-03-16 ENCOUNTER — Telehealth: Payer: Self-pay

## 2019-03-16 LAB — NOVEL CORONAVIRUS, NAA: SARS-CoV-2, NAA: NOT DETECTED

## 2019-03-16 NOTE — Telephone Encounter (Signed)
Pt. Given COVID 19 results, verbalizes understanding. 

## 2019-03-22 ENCOUNTER — Other Ambulatory Visit: Payer: Self-pay

## 2019-03-22 ENCOUNTER — Other Ambulatory Visit: Payer: Self-pay | Admitting: Surgery

## 2019-03-22 ENCOUNTER — Ambulatory Visit
Admission: RE | Admit: 2019-03-22 | Discharge: 2019-03-22 | Disposition: A | Discharge status: Home / Self Care | Payer: Medicare Other | Source: Ambulatory Visit | Attending: Surgery | Admitting: Surgery

## 2019-03-22 DIAGNOSIS — N6320 Unspecified lump in the left breast, unspecified quadrant: Secondary | ICD-10-CM

## 2019-03-27 ENCOUNTER — Other Ambulatory Visit: Payer: Self-pay

## 2019-03-27 ENCOUNTER — Ambulatory Visit
Admission: RE | Admit: 2019-03-27 | Discharge: 2019-03-27 | Disposition: A | Discharge status: Home / Self Care | Payer: Medicare Other | Source: Ambulatory Visit | Attending: Surgery | Admitting: Surgery

## 2019-03-27 DIAGNOSIS — N6320 Unspecified lump in the left breast, unspecified quadrant: Secondary | ICD-10-CM

## 2019-04-20 ENCOUNTER — Other Ambulatory Visit: Payer: Self-pay

## 2019-04-20 ENCOUNTER — Ambulatory Visit: Payer: Medicare Other | Attending: Internal Medicine

## 2019-04-20 DIAGNOSIS — Z20822 Contact with and (suspected) exposure to covid-19: Secondary | ICD-10-CM

## 2019-04-21 LAB — NOVEL CORONAVIRUS, NAA: SARS-CoV-2, NAA: NOT DETECTED

## 2019-04-25 ENCOUNTER — Telehealth: Payer: Self-pay

## 2019-04-25 NOTE — Telephone Encounter (Signed)
Pt. Given COVID 19 test results, verbalizes understanding.

## 2019-08-02 ENCOUNTER — Telehealth: Payer: Self-pay

## 2019-08-02 ENCOUNTER — Ambulatory Visit: Payer: Medicare Other

## 2019-08-02 NOTE — Telephone Encounter (Signed)
Telephone appt cancelled this afternoon. Pt will come in tomorrow at 3:15 with [facility].

## 2019-08-02 NOTE — Telephone Encounter (Signed)
Has an after hours televisit this pm but would really like to come in the office and be seen. She has a inner thigh rash that is getting worse. It has been present for 2-3 months. Cream is not helping (she does not remember name and it is not on med list) The rash is itching and painful.  An appt has opened with [facility] tomorrow pm. Pt has been scheduled. Both [facility] and Dr. Warrick Parisian have approved. Left pt a message to make sure she can attend and that she has no respiratory concerns.

## 2019-08-03 ENCOUNTER — Ambulatory Visit (INDEPENDENT_AMBULATORY_CARE_PROVIDER_SITE_OTHER): Payer: Medicare Other

## 2019-08-03 ENCOUNTER — Encounter: Payer: Self-pay | Admitting: Family Medicine

## 2019-08-03 ENCOUNTER — Other Ambulatory Visit: Payer: Self-pay

## 2019-08-03 ENCOUNTER — Ambulatory Visit: Payer: Medicare Other | Admitting: Family Medicine

## 2019-08-03 VITALS — BP 118/76 | HR 64 | Temp 98.4°F | Resp 20 | Ht 64.0 in | Wt 150.1 lb

## 2019-08-03 DIAGNOSIS — G8929 Other chronic pain: Secondary | ICD-10-CM

## 2019-08-03 DIAGNOSIS — M25552 Pain in left hip: Secondary | ICD-10-CM

## 2019-08-03 DIAGNOSIS — N898 Other specified noninflammatory disorders of vagina: Secondary | ICD-10-CM

## 2019-08-03 LAB — WET PREP FOR TRICH, YEAST, CLUE
Clue Cell Exam: NEGATIVE
Trichomonas Exam: NEGATIVE
Yeast Exam: NEGATIVE

## 2019-08-03 MED ORDER — METRONIDAZOLE 500 MG PO TABS
500.0000 mg | ORAL_TABLET | Freq: Two times a day (BID) | ORAL | 0 refills | Status: DC
Start: 2019-08-03 — End: 2019-08-08
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
## 2019-08-07 ENCOUNTER — Encounter: Payer: Self-pay | Admitting: Family Medicine

## 2019-08-08 ENCOUNTER — Other Ambulatory Visit: Payer: Self-pay | Admitting: Family Medicine

## 2019-08-08 ENCOUNTER — Telehealth: Payer: Self-pay | Admitting: Family Medicine

## 2019-08-08 MED ORDER — METRONIDAZOLE 500 MG PO TABS: 500.0000 mg | ORAL_TABLET | Freq: Two times a day (BID) | ORAL | 0 refills | Status: DC

## 2019-08-08 NOTE — Telephone Encounter (Signed)
  Prescription Request  08/08/2019  What is the name of the medication or equipment? Flagyl  Have you contacted your pharmacy to request a refill? (if applicable) No  Which pharmacy would you like this sent to? [place] says she is still itching a little and wants to know if Dr Livia Snellen will send her in a refill for this Rx.   Patient notified that their request is being sent to the clinical staff for review and that they should receive a response within 2 business days.

## 2019-08-08 NOTE — Telephone Encounter (Signed)
Patient is requesting refill of Flagyl.  She was seen last week for vaginal discharge and itching.

## 2019-08-08 NOTE — Telephone Encounter (Signed)
Done. Thanks, WS 

## 2019-08-09 NOTE — Telephone Encounter (Signed)
Left detailed message on patients voicemail that rx sent to pharmacy 

## 2019-09-19 ENCOUNTER — Encounter: Payer: Self-pay | Admitting: Family Medicine

## 2019-09-19 ENCOUNTER — Ambulatory Visit: Payer: Medicare Other | Admitting: Family Medicine

## 2019-09-19 ENCOUNTER — Other Ambulatory Visit: Payer: Self-pay

## 2019-09-19 VITALS — BP 136/80 | HR 73 | Temp 97.4°F | Ht 64.0 in | Wt 152.4 lb

## 2019-09-19 DIAGNOSIS — Z5320 Procedure and treatment not carried out because of patient's decision for unspecified reasons: Secondary | ICD-10-CM

## 2019-09-19 DIAGNOSIS — I609 Nontraumatic subarachnoid hemorrhage, unspecified: Secondary | ICD-10-CM | POA: Diagnosis not present

## 2019-09-19 DIAGNOSIS — Z79899 Other long term (current) drug therapy: Secondary | ICD-10-CM | POA: Diagnosis not present

## 2019-09-19 DIAGNOSIS — G4489 Other headache syndrome: Secondary | ICD-10-CM

## 2019-09-19 DIAGNOSIS — Z5329 Procedure and treatment not carried out because of patient's decision for other reasons: Secondary | ICD-10-CM
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
## 2019-09-21 ENCOUNTER — Other Ambulatory Visit: Payer: Self-pay | Admitting: Family Medicine

## 2019-09-21 MED ORDER — TRAMADOL HCL 50 MG PO TABS: 50.0000 mg | ORAL_TABLET | Freq: Four times a day (QID) | ORAL | 5 refills | Status: DC | PRN

## 2019-09-23 LAB — TOXASSURE SELECT 13 (MW), URINE

## 2020-02-16 ENCOUNTER — Other Ambulatory Visit: Payer: Self-pay | Admitting: Surgery

## 2020-02-16 DIAGNOSIS — Z1231 Encounter for screening mammogram for malignant neoplasm of breast: Secondary | ICD-10-CM

## 2020-03-20 ENCOUNTER — Encounter: Payer: Self-pay | Admitting: Family Medicine

## 2020-03-20 ENCOUNTER — Ambulatory Visit: Payer: Medicare Other | Admitting: Family Medicine

## 2020-03-20 ENCOUNTER — Other Ambulatory Visit: Payer: Self-pay

## 2020-03-20 VITALS — BP 135/85 | HR 67 | Temp 96.7°F | Ht 64.0 in | Wt 154.0 lb

## 2020-03-20 DIAGNOSIS — M8580 Other specified disorders of bone density and structure, unspecified site: Secondary | ICD-10-CM | POA: Diagnosis not present

## 2020-03-20 DIAGNOSIS — G4489 Other headache syndrome: Secondary | ICD-10-CM | POA: Diagnosis not present

## 2020-03-27 ENCOUNTER — Encounter: Payer: Self-pay | Admitting: Family Medicine

## 2020-03-27 MED ORDER — TRAMADOL HCL 50 MG PO TABS: 50.0000 mg | ORAL_TABLET | Freq: Four times a day (QID) | ORAL | 5 refills | Status: DC | PRN
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
## 2020-03-28 ENCOUNTER — Other Ambulatory Visit: Payer: Self-pay

## 2020-03-28 ENCOUNTER — Ambulatory Visit
Admission: RE | Admit: 2020-03-28 | Discharge: 2020-03-28 | Disposition: A | Discharge status: Home / Self Care | Payer: Medicare Other | Source: Ambulatory Visit | Attending: Surgery | Admitting: Surgery

## 2020-03-28 DIAGNOSIS — Z1231 Encounter for screening mammogram for malignant neoplasm of breast: Secondary | ICD-10-CM

## 2020-09-18 ENCOUNTER — Encounter: Payer: Self-pay | Admitting: Family Medicine

## 2020-09-18 ENCOUNTER — Ambulatory Visit: Payer: Medicare Other | Admitting: Family Medicine

## 2020-09-18 DIAGNOSIS — J069 Acute upper respiratory infection, unspecified: Secondary | ICD-10-CM

## 2020-09-18 MED ORDER — AZITHROMYCIN 250 MG PO TABS: ORAL_TABLET | ORAL | 0 refills | Status: AC

## 2020-09-18 NOTE — Progress Notes (Signed)
Subjective:    Patient ID: Sheila Patterson, female    DOB: [birth year], 65 y.o.   MRN: [id number]   HPI: Sheila Patterson is a 65 y.o. female presenting for sore throat, runny nose and cough. Onset 2 days ago. No body aches. No dyspnea. Some decrease in sense of smell along with congestion Feeling tired and malaise. Subjective low grade fever.    Depression screen [facility] 2/9 03/20/2020 09/19/2019 08/03/2019 11/22/2017 05/19/2017  Decreased Interest 0 0 0 0 0  Down, Depressed, Hopeless 0 0 0 0 0  PHQ - 2 Score 0 0 0 0 0     Relevant past medical, surgical, family and social history reviewed and updated as indicated.  Interim medical history since our last visit reviewed. Allergies and medications reviewed and updated.  ROS:  Review of Systems   Social History   Tobacco Use  Smoking Status Former   Pack years: 0.00   Types: Cigarettes   Quit date: 04/06/1992   Years since quitting: 28.4  Smokeless Tobacco Never       Objective:     Wt Readings from Last 3 Encounters:  03/20/20 154 lb (69.9 kg)  09/19/19 152 lb 6.4 oz (69.1 kg)  08/03/19 150 lb 2 oz (68.1 kg)     Exam deferred. Pt. Harboring due to COVID 19. Phone visit performed.   Assessment & Plan:   1. Upper respiratory tract infection, unspecified type     Meds ordered this encounter  Medications   azithromycin (ZITHROMAX) 250 MG tablet    Sig: Take 2 tablets on day 1, then 1 tablet daily on days 2 through 5    Dispense:  6 tablet    Refill:  0    Orders Placed This Encounter  Procedures   Novel Coronavirus, NAA ([facility])    Order Specific Question:   Is this test for diagnosis or screening    Answer:   Diagnosis of ill patient    Order Specific Question:   Symptomatic for COVID-19 as defined by CDC    Answer:   Yes    Order Specific Question:   Date of Symptom Onset    Answer:   09/16/2020    Order Specific Question:   Hospitalized for COVID-19    Answer:   No    Order Specific Question:   Admitted  to ICU for COVID-19    Answer:   No    Order Specific Question:   Previously tested for COVID-19    Answer:   Yes    Order Specific Question:   Resident in a congregate (group) care setting    Answer:   No    Order Specific Question:   Is the patient student?    Answer:   No    Order Specific Question:   Employed in healthcare setting    Answer:   No    Order Specific Question:   Pregnant    Answer:   No    Order Specific Question:   Has patient completed COVID vaccination(s) (2 doses of Pfizer/Moderna 1 dose of [employer])    Answer:   No      Diagnoses and all orders for this visit:  Upper respiratory tract infection, unspecified type -     Novel Coronavirus, NAA ([facility])  Other orders -     azithromycin (ZITHROMAX) 250 MG tablet; Take 2 tablets on day 1, then 1 tablet daily on days 2 through 5  Virtual Visit via telephone Note  I discussed the limitations, risks, security and privacy concerns of performing an evaluation and management service by telephone and the availability of in person appointments. The patient was identified with two identifiers. Pt.expressed understanding and agreed to proceed. Pt. Is at home. Dr. Livia Snellen is in his office.  Follow Up Instructions:   I discussed the assessment and treatment plan with the patient. The patient was provided an opportunity to ask questions and all were answered. The patient agreed with the plan and demonstrated an understanding of the instructions.   The patient was advised to call back or seek an in-person evaluation if the symptoms worsen or if the condition fails to improve as anticipated.   Total minutes including chart review and phone contact time: 12   Follow up plan: Return if symptoms worsen or fail to improve.  [name], MD [place]

## 2020-09-19 LAB — SARS-COV-2, NAA 2 DAY TAT

## 2020-09-19 LAB — NOVEL CORONAVIRUS, NAA: SARS-CoV-2, NAA: DETECTED — AB

## 2020-10-03 ENCOUNTER — Encounter: Payer: Self-pay | Admitting: Family Medicine

## 2020-10-03 ENCOUNTER — Ambulatory Visit: Payer: Medicare Other | Admitting: Family Medicine

## 2020-10-03 ENCOUNTER — Other Ambulatory Visit: Payer: Self-pay

## 2020-10-03 ENCOUNTER — Telehealth: Payer: Self-pay | Admitting: Family Medicine

## 2020-10-03 VITALS — BP 125/84 | HR 88 | Temp 97.5°F | Ht 64.0 in | Wt 151.6 lb

## 2020-10-03 DIAGNOSIS — Z79899 Other long term (current) drug therapy: Secondary | ICD-10-CM

## 2020-10-03 DIAGNOSIS — E782 Mixed hyperlipidemia: Secondary | ICD-10-CM | POA: Diagnosis not present

## 2020-10-03 DIAGNOSIS — G4489 Other headache syndrome: Secondary | ICD-10-CM | POA: Diagnosis not present

## 2020-10-03 LAB — CBC WITH DIFFERENTIAL/PLATELET
Basophils Absolute: 0.1 10*3/uL (ref 0.0–0.2)
Basos: 1 %
EOS (ABSOLUTE): 0.1 10*3/uL (ref 0.0–0.4)
Eos: 2 %
Hematocrit: 43 % (ref 34.0–46.6)
Hemoglobin: 13.7 g/dL (ref 11.1–15.9)
Immature Grans (Abs): 0 10*3/uL (ref 0.0–0.1)
Immature Granulocytes: 0 %
Lymphocytes Absolute: 2.3 10*3/uL (ref 0.7–3.1)
Lymphs: 35 %
MCH: 28.7 pg (ref 26.6–33.0)
MCHC: 31.9 g/dL (ref 31.5–35.7)
MCV: 90 fL (ref 79–97)
Monocytes Absolute: 0.7 10*3/uL (ref 0.1–0.9)
Monocytes: 10 %
Neutrophils Absolute: 3.5 10*3/uL (ref 1.4–7.0)
Neutrophils: 52 %
Platelets: 384 10*3/uL (ref 150–450)
RBC: 4.78 x10E6/uL (ref 3.77–5.28)
RDW: 12.5 % (ref 11.7–15.4)
WBC: 6.7 10*3/uL (ref 3.4–10.8)

## 2020-10-03 LAB — CMP14+EGFR
ALT: 18 IU/L (ref 0–32)
AST: 17 IU/L (ref 0–40)
Albumin/Globulin Ratio: 1.8 (ref 1.2–2.2)
Albumin: 4.3 g/dL (ref 3.8–4.8)
Alkaline Phosphatase: 80 IU/L (ref 44–121)
BUN/Creatinine Ratio: 25 (ref 12–28)
BUN: 19 mg/dL (ref 8–27)
Bilirubin Total: 0.3 mg/dL (ref 0.0–1.2)
CO2: 22 mmol/L (ref 20–29)
Calcium: 10 mg/dL (ref 8.7–10.3)
Chloride: 102 mmol/L (ref 96–106)
Creatinine, Ser: 0.77 mg/dL (ref 0.57–1.00)
Globulin, Total: 2.4 g/dL (ref 1.5–4.5)
Glucose: 98 mg/dL (ref 65–99)
Potassium: 5.2 mmol/L (ref 3.5–5.2)
Sodium: 142 mmol/L (ref 134–144)
Total Protein: 6.7 g/dL (ref 6.0–8.5)
eGFR: 87 mL/min/{1.73_m2} (ref >59)

## 2020-10-03 LAB — LIPID PANEL
Chol/HDL Ratio: 5.2 ratio — ABNORMAL HIGH (ref 0.0–4.4)
Cholesterol, Total: 244 mg/dL — ABNORMAL HIGH (ref 100–199)
HDL: 47 mg/dL (ref >39)
LDL Chol Calc (NIH): 157 mg/dL — ABNORMAL HIGH (ref 0–99)
Triglycerides: 217 mg/dL — ABNORMAL HIGH (ref 0–149)
VLDL Cholesterol Cal: 40 mg/dL (ref 5–40)

## 2020-10-03 MED ORDER — TRAMADOL HCL 50 MG PO TABS: 50.0000 mg | ORAL_TABLET | Freq: Four times a day (QID) | ORAL | 5 refills | Status: AC | PRN

## 2020-10-03 NOTE — Telephone Encounter (Signed)
The reason is because of a conversation about controlled substance agreement. As a result she cannot change provider.

## 2020-10-03 NOTE — Telephone Encounter (Signed)
Patient would like to switch providers. Patient would like [facility] to be her PCP.   Please advise and let patient know if request was approved.

## 2020-10-04 ENCOUNTER — Encounter: Payer: Self-pay | Admitting: Family Medicine
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
## 2020-10-04 NOTE — Telephone Encounter (Signed)
Lmtcb.

## 2020-10-10 LAB — TOXASSURE SELECT 13 (MW), URINE

## 2020-11-29 ENCOUNTER — Other Ambulatory Visit: Payer: Self-pay | Admitting: Surgery

## 2020-11-29 DIAGNOSIS — Z1231 Encounter for screening mammogram for malignant neoplasm of breast: Secondary | ICD-10-CM

## 2020-12-03 ENCOUNTER — Other Ambulatory Visit: Payer: Self-pay | Admitting: Surgery

## 2020-12-03 DIAGNOSIS — N63 Unspecified lump in unspecified breast: Secondary | ICD-10-CM

## 2020-12-25 ENCOUNTER — Other Ambulatory Visit (HOSPITAL_BASED_OUTPATIENT_CLINIC_OR_DEPARTMENT_OTHER): Payer: Self-pay | Admitting: Neurosurgery

## 2020-12-25 ENCOUNTER — Other Ambulatory Visit (HOSPITAL_COMMUNITY): Payer: Self-pay | Admitting: Neurosurgery

## 2020-12-25 DIAGNOSIS — Z8679 Personal history of other diseases of the circulatory system: Secondary | ICD-10-CM

## 2020-12-26 ENCOUNTER — Other Ambulatory Visit (HOSPITAL_COMMUNITY): Payer: Self-pay | Admitting: Neurosurgery

## 2020-12-26 DIAGNOSIS — Z8679 Personal history of other diseases of the circulatory system: Secondary | ICD-10-CM

## 2021-01-08 ENCOUNTER — Other Ambulatory Visit: Payer: Self-pay

## 2021-01-08 ENCOUNTER — Ambulatory Visit (HOSPITAL_COMMUNITY)
Admission: RE | Admit: 2021-01-08 | Discharge: 2021-01-08 | Disposition: A | Discharge status: Home / Self Care | Payer: Medicare Other | Source: Ambulatory Visit | Attending: Neurosurgery | Admitting: Neurosurgery

## 2021-01-08 ENCOUNTER — Other Ambulatory Visit (HOSPITAL_COMMUNITY): Payer: Self-pay | Admitting: Neurosurgery

## 2021-01-08 DIAGNOSIS — R519 Headache, unspecified: Secondary | ICD-10-CM | POA: Insufficient documentation

## 2021-01-08 DIAGNOSIS — Z8679 Personal history of other diseases of the circulatory system: Secondary | ICD-10-CM | POA: Diagnosis not present

## 2021-01-08 DIAGNOSIS — G9389 Other specified disorders of brain: Secondary | ICD-10-CM | POA: Diagnosis not present

## 2021-01-08 MED ORDER — GADOBUTROL 1 MMOL/ML IV SOLN
7.0000 mL | Freq: Once | INTRAVENOUS | Status: AC | PRN
  Administered 2021-01-08: 7 mL via INTRAVENOUS

## 2021-04-01 ENCOUNTER — Ambulatory Visit: Payer: Medicare Other

## 2021-04-01 ENCOUNTER — Ambulatory Visit
Admission: RE | Admit: 2021-04-01 | Discharge: 2021-04-01 | Disposition: A | Discharge status: Home / Self Care | Payer: Medicare Other | Source: Ambulatory Visit | Attending: Surgery | Admitting: Surgery

## 2021-04-01 DIAGNOSIS — N63 Unspecified lump in unspecified breast: Secondary | ICD-10-CM

## 2021-04-08 ENCOUNTER — Ambulatory Visit: Payer: Medicare Other | Admitting: Family Medicine

## 2022-01-31 IMAGING — MG MM DIGITAL DIAGNOSTIC UNILAT*L* W/ TOMO W/ CAD
4 series · 4 of 12 positions shown · non-contrast
Comparison: Previous exam(s).

CLINICAL DATA: Patient reports a lower left breast lump that she
noted over the [REDACTED], but which has more recently resolved. The
patient currently has no left breast complaints. History of a right
mastectomy breast carcinoma performed in 8477. History of benign
left breast biopsy in 2948.

EXAM:
DIGITAL DIAGNOSTIC UNILATERAL LEFT MAMMOGRAM WITH TOMOSYNTHESIS AND
CAD
TECHNIQUE: Left digital diagnostic mammography and breast tomosynthesis was
performed. The images were evaluated with computer-aided detection.

[L CC synth-2D]
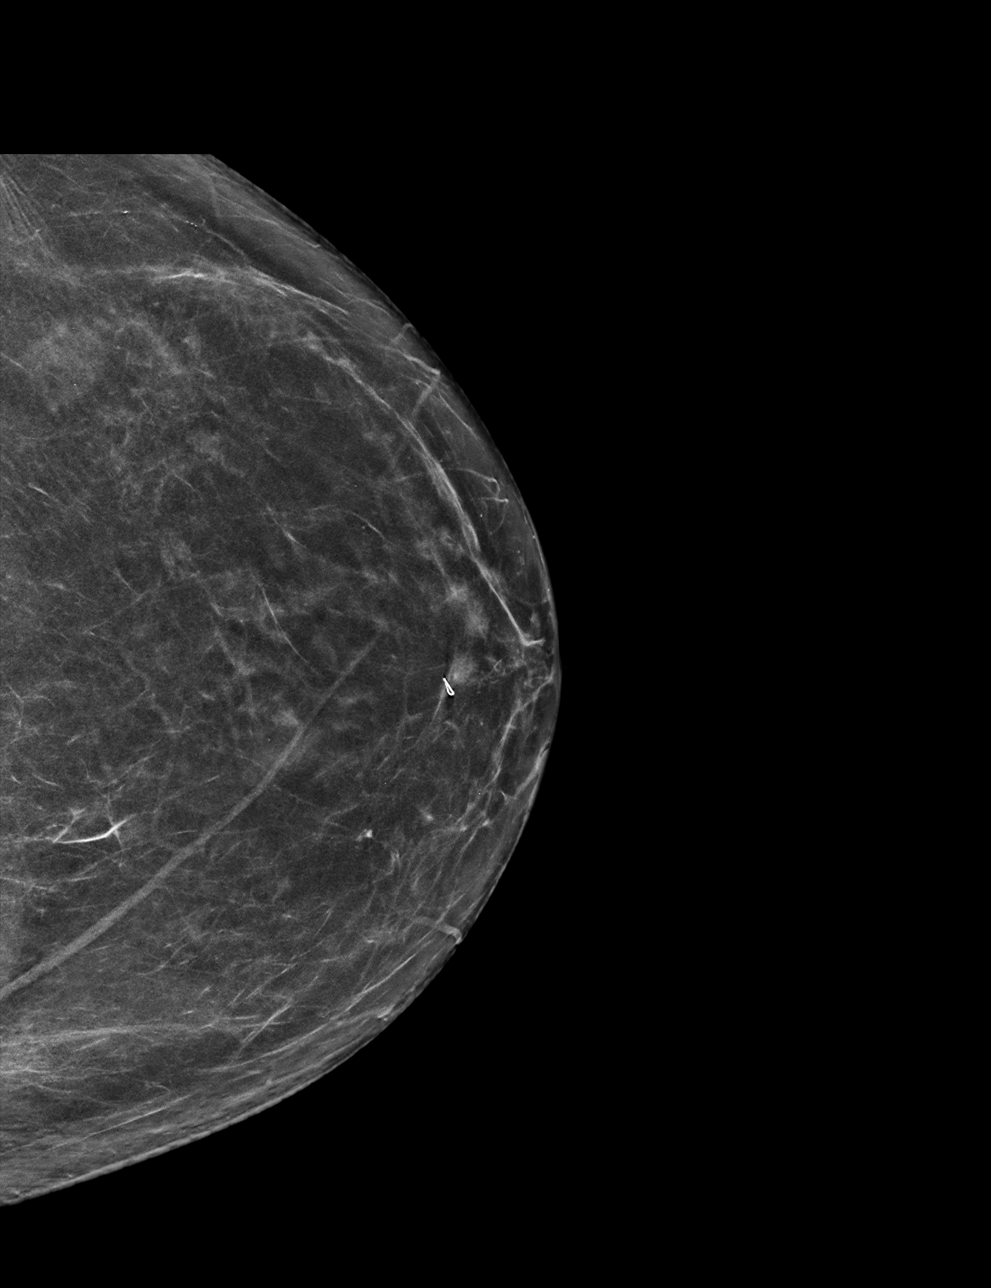

[L MLO synth-2D]
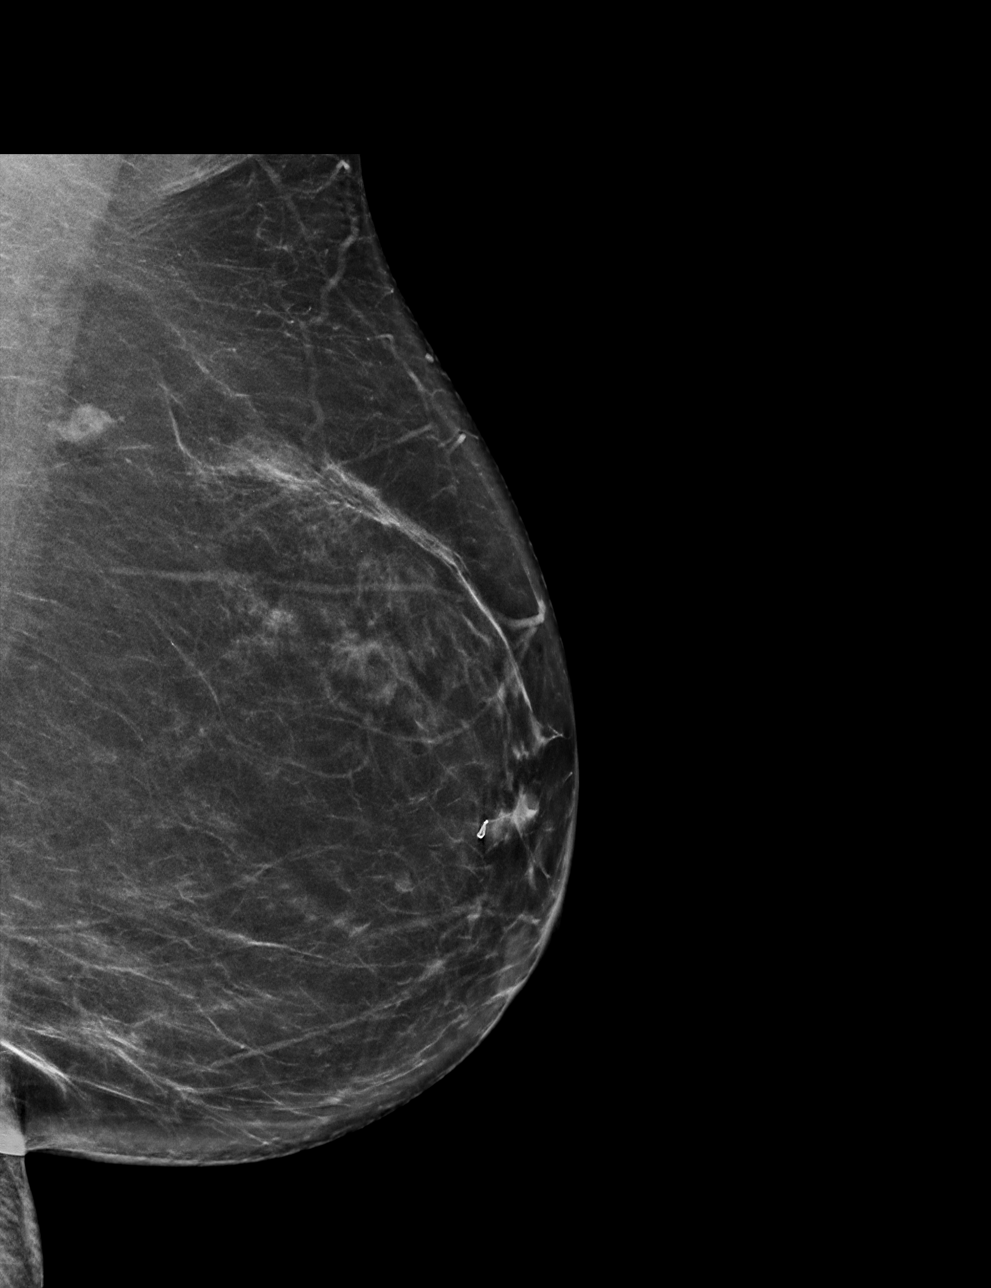

[L MLO tomo · tomo slice 34/67.0]
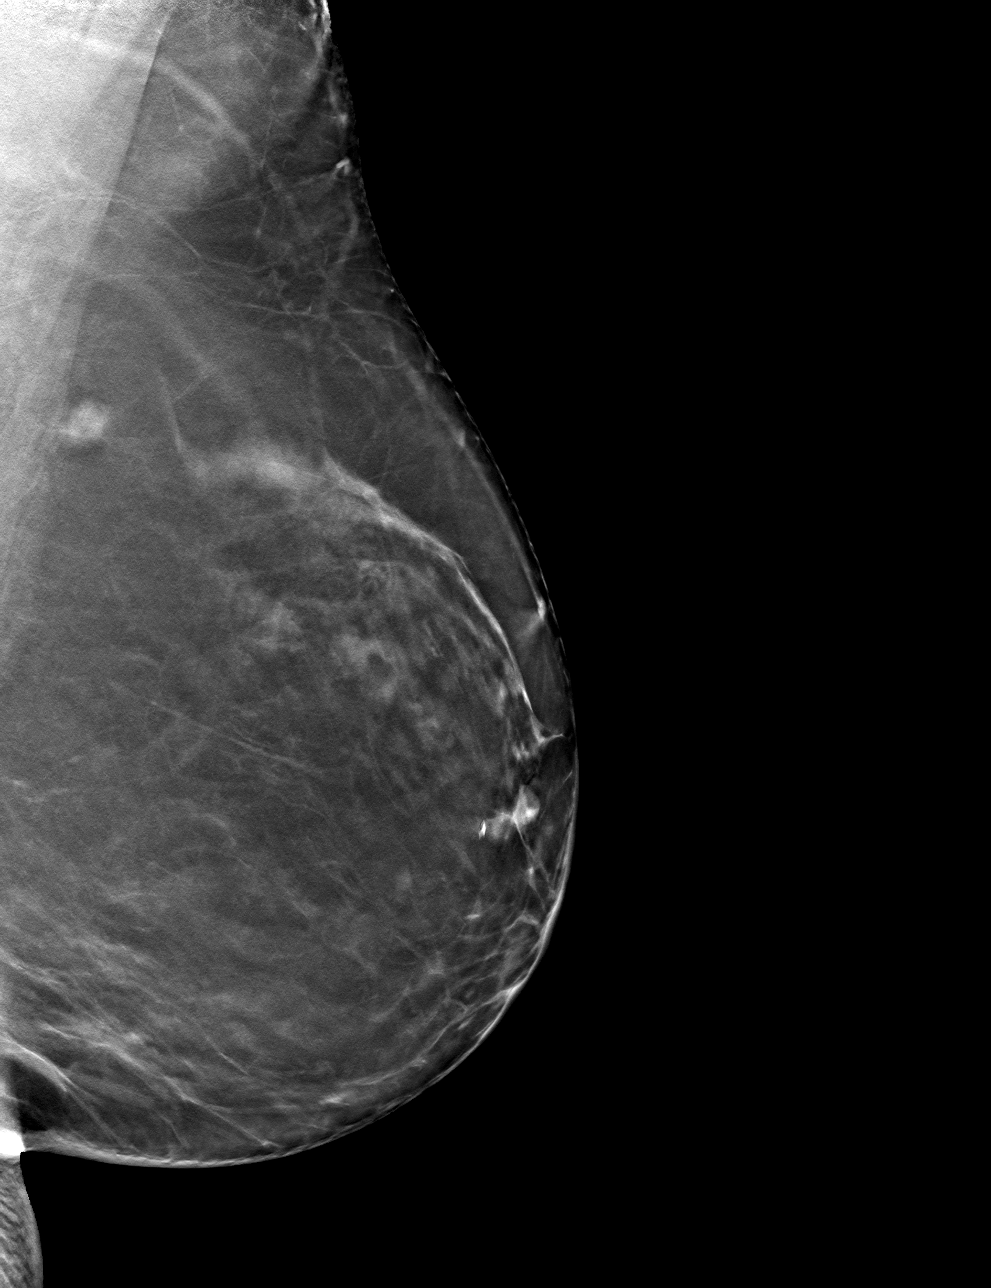

[L CC tomo · tomo slice 31/60.0]
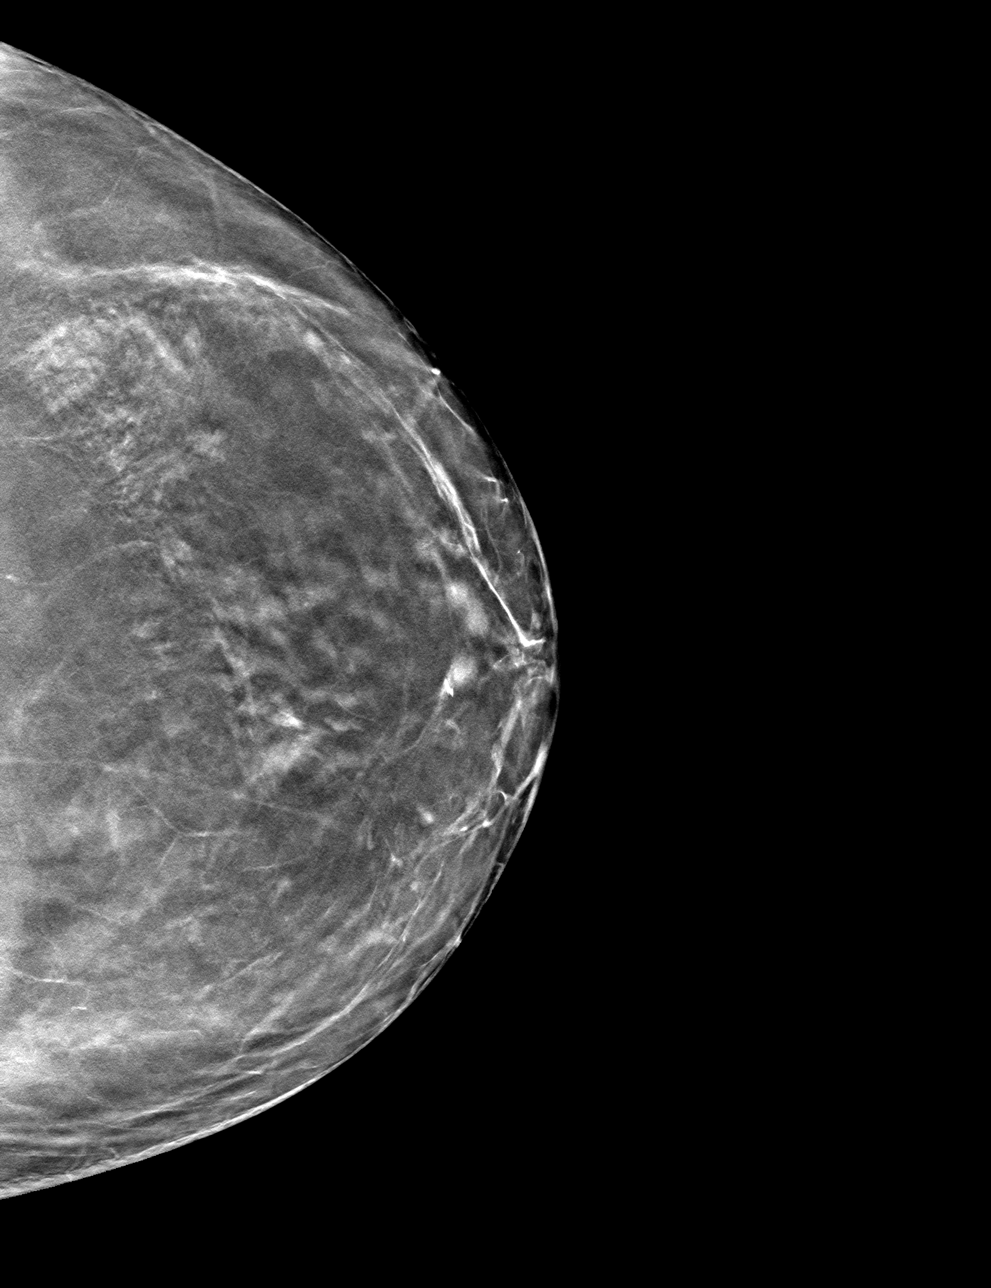

[4 of 12 positions shown; findings below may reference images not displayed]

ACR Breast Density Category b: There are scattered areas of
fibroglandular density.
FINDINGS: There are no suspicious masses, areas of architectural distortion,
areas of significant asymmetry or suspicious calcifications. No
mammographic change.
IMPRESSION: 1. No evidence of breast malignancy.

RECOMMENDATION:
Screening mammogram in one year.(Code:[id number])

I have discussed the findings and recommendations with the patient.
If applicable, a reminder letter will be sent to the patient
regarding the next appointment.

BI-RADS CATEGORY  1: Negative.

## 2022-02-20 ENCOUNTER — Other Ambulatory Visit: Payer: Self-pay | Admitting: Surgery

## 2022-02-20 DIAGNOSIS — Z1231 Encounter for screening mammogram for malignant neoplasm of breast: Secondary | ICD-10-CM

## 2022-03-04 ENCOUNTER — Other Ambulatory Visit: Payer: Self-pay

## 2022-03-04 ENCOUNTER — Encounter (HOSPITAL_COMMUNITY): Payer: Self-pay | Admitting: Emergency Medicine

## 2022-03-04 ENCOUNTER — Emergency Department (HOSPITAL_COMMUNITY)
Admission: EM | Admit: 2022-03-04 | Discharge: 2022-03-04 | Disposition: A | Discharge status: Home / Self Care | Payer: Medicare Other | Attending: Emergency Medicine | Admitting: Emergency Medicine

## 2022-03-04 DIAGNOSIS — W28XXXA Contact with powered lawn mower, initial encounter: Secondary | ICD-10-CM | POA: Insufficient documentation

## 2022-03-04 DIAGNOSIS — S39012A Strain of muscle, fascia and tendon of lower back, initial encounter: Secondary | ICD-10-CM | POA: Insufficient documentation

## 2022-03-04 DIAGNOSIS — S3992XA Unspecified injury of lower back, initial encounter: Secondary | ICD-10-CM | POA: Diagnosis present

## 2022-03-04 DIAGNOSIS — Z7982 Long term (current) use of aspirin: Secondary | ICD-10-CM | POA: Diagnosis not present

## 2022-03-04 DIAGNOSIS — M62830 Muscle spasm of back: Secondary | ICD-10-CM | POA: Diagnosis not present

## 2022-03-04 MED ORDER — LIDOCAINE 5 % EX PTCH: 1.0000 | MEDICATED_PATCH | CUTANEOUS | 0 refills | Status: DC

## 2022-03-04 MED ORDER — LIDOCAINE 5 % EX PTCH
1.0000 | MEDICATED_PATCH | CUTANEOUS | Status: DC
  Administered 2022-03-04: 1 via TRANSDERMAL
  Filled 2022-03-04: qty 1

## 2022-03-04 MED ORDER — NAPROXEN 375 MG PO TABS: 375.0000 mg | ORAL_TABLET | Freq: Two times a day (BID) | ORAL | 0 refills | Status: DC

## 2022-03-04 MED ORDER — DIAZEPAM 5 MG PO TABS
5.0000 mg | ORAL_TABLET | Freq: Once | ORAL | Status: AC
  Administered 2022-03-04: 5 mg via ORAL
  Filled 2022-03-04: qty 1

## 2022-03-04 MED ORDER — KETOROLAC TROMETHAMINE 15 MG/ML IJ SOLN
15.0000 mg | Freq: Once | INTRAMUSCULAR | Status: AC
  Administered 2022-03-04: 15 mg via INTRAMUSCULAR
  Filled 2022-03-04: qty 1

## 2022-03-04 MED ORDER — METHOCARBAMOL 500 MG PO TABS: 500.0000 mg | ORAL_TABLET | Freq: Two times a day (BID) | ORAL | 0 refills | Status: DC

## 2022-03-04 NOTE — ED Provider Notes (Signed)
[facility] EMERGENCY DEPARTMENT Provider Note   CSN: [id number] Arrival date & time: 03/04/22  1622     History  Chief Complaint  Patient presents with   Back Pain    Sheila Patterson is a 66 y.o. female.  66 year old female presents today for evaluation of back pain.  She states she was using a riding mower earlier today around 10 AM.  She states she was going up a hill, with her riding mower stopped and started to roll back.  She states she turned her steering well causing her to fall over.  Denies any crush injury to her leg.  Has been ambulatory since then.  Her low back pain has progressively worsened since then.  Denies any bowel or bladder incontinence, weakness of either lower extremity, saddle anesthesia.  The history is provided by the patient. No language interpreter was used.       Home Medications Prior to Admission medications   Medication Sig Start Date End Date Taking? Authorizing Provider  lidocaine (LIDODERM) 5 % Place 1 patch onto the skin daily. Remove & Discard patch within 12 hours or as directed by MD 03/04/22  Yes Deatra Canter, Obaloluwa Delatte, PA-C  methocarbamol (ROBAXIN) 500 MG tablet Take 1 tablet (500 mg total) by mouth 2 (two) times daily. 03/04/22  Yes Bera Pinela, PA-C  naproxen (NAPROSYN) 375 MG tablet Take 1 tablet (375 mg total) by mouth 2 (two) times daily. 03/04/22  Yes Kaysia Willard, PA-C  acetaminophen (TYLENOL) 500 MG tablet Take 500 mg by mouth every 6 (six) hours as needed. For pain    [provider]  aspirin 325 MG tablet Take 325 mg by mouth daily.    [provider]  traMADol (ULTRAM) 50 MG tablet Take 1 tablet (50 mg total) by mouth every 6 (six) hours as needed. 10/03/20   [name], MD      Allergies    Codeine and Morphine and related    Review of Systems   Review of Systems  Constitutional:  Negative for fatigue and fever.  Gastrointestinal:  Negative for abdominal pain and vomiting.  Genitourinary:  Negative for difficulty  urinating.  Musculoskeletal:  Positive for back pain. Negative for arthralgias, neck pain and neck stiffness.  Neurological:  Negative for weakness and light-headedness.  All other systems reviewed and are negative.   Physical Exam Updated Vital Signs BP (!) 162/79 (BP Location: Left Arm)   Pulse 91   Temp 98.4 F (36.9 C) (Oral)   Resp 16   Ht '5\' 4"'$  (1.626 m)   Wt 68 kg   SpO2 97%   BMI 25.75 kg/m  Physical Exam Vitals and nursing note reviewed.  Constitutional:      General: She is not in acute distress.    Appearance: Normal appearance. She is not ill-appearing.  HENT:     Head: Normocephalic and atraumatic.     Nose: Nose normal.  Eyes:     General: No scleral icterus.    Extraocular Movements: Extraocular movements intact.     Conjunctiva/sclera: Conjunctivae normal.  Cardiovascular:     Rate and Rhythm: Normal rate and regular rhythm.     Pulses: Normal pulses.  Pulmonary:     Effort: Pulmonary effort is normal. No respiratory distress.     Breath sounds: Normal breath sounds. No wheezing or rales.  Abdominal:     General: There is no distension.     Palpations: Abdomen is soft.     Tenderness: There is  no abdominal tenderness. There is no guarding.  Musculoskeletal:        General: Normal range of motion.     Cervical back: Normal range of motion.     Comments: Mild tenderness palpation over the lumbar spine.  Cervical, thoracic spine without tenderness to palpation.  Lumbar paraspinal muscles with mild tenderness to palpation.  Full range of motion of bilateral lower extremities with 5/5 strength of the extensor and flexor muscle groups.  Patient is able ambulate without difficulty.  Skin:    General: Skin is warm and dry.  Neurological:     General: No focal deficit present.     Mental Status: She is alert. Mental status is at baseline.     ED Results / Procedures / Treatments   Labs (all labs ordered are listed, but only abnormal results are  displayed) Labs Reviewed - No data to display  EKG None  Radiology No results found.  Procedures Procedures    Medications Ordered in ED Medications  lidocaine (LIDODERM) 5 % 1 patch (1 patch Transdermal Patch Applied 03/04/22 1837)  diazepam (VALIUM) tablet 5 mg (5 mg Oral Given 03/04/22 1841)  ketorolac (TORADOL) 15 MG/ML injection 15 mg (15 mg Intramuscular Given 03/04/22 1839)    ED Course/ Medical Decision Making/ A&P                           Medical Decision Making Risk Prescription drug management.   66 year old female presents today for evaluation of back pain.  This occurred as she fell off of the riding mower earlier today.  Exam overall reassuring.  Intact neurovascularly.  Intact strength.  Will give multimodal pain regimen in the ED.  Will discharge with naproxen, Robaxin, lidocaine patch.  Imaging discussed with patient as well as low suspicion for spinal process fracture given the progressive worsening of pain.  Patient is in agreement to defer this today.  She will return for any worsening or concerning symptoms.  She will follow-up with her PCP.  Low suspicion for cauda equina syndrome as there is no weakness, without saddle anesthesia, or bowel or bladder incontinence.  Likely a muscle spasm along with a muscle strain. Discussed with attending who is in agreement.    Final Clinical Impression(s) / ED Diagnoses Final diagnoses:  Strain of lumbar region, initial encounter  Muscle spasm of back    Rx / DC Orders ED Discharge Orders          Ordered    methocarbamol (ROBAXIN) 500 MG tablet  2 times daily        03/04/22 1819    naproxen (NAPROSYN) 375 MG tablet  2 times daily        03/04/22 1819    lidocaine (LIDODERM) 5 %  Every 24 hours        03/04/22 1819              Evlyn Courier, PA-C 03/04/22 1902    Noemi Chapel, MD 03/04/22 1912

## 2022-03-04 NOTE — ED Triage Notes (Signed)
Pt c/o lower, mid back pain after flipping a lawn mower over on herself earlier today

## 2022-03-04 NOTE — Discharge Instructions (Signed)
Your exam today was overall reassuring.  You likely have a muscle strain/spasm.  You received a few medications in the emergency room.  I have sent muscle relaxer, anti-inflammatory, lidocaine patch into the pharmacy for you.  For any concerning symptoms please return to the emergency room otherwise please follow-up with your primary care provider.

## 2022-03-13 ENCOUNTER — Other Ambulatory Visit: Payer: Self-pay | Admitting: Surgery

## 2022-03-13 ENCOUNTER — Encounter: Payer: Self-pay | Admitting: Surgery

## 2022-03-13 DIAGNOSIS — N6320 Unspecified lump in the left breast, unspecified quadrant: Secondary | ICD-10-CM

## 2022-03-13 DIAGNOSIS — Z1231 Encounter for screening mammogram for malignant neoplasm of breast: Secondary | ICD-10-CM

## 2022-03-13 DIAGNOSIS — Z853 Personal history of malignant neoplasm of breast: Secondary | ICD-10-CM

## 2022-05-05 ENCOUNTER — Ambulatory Visit
Admission: RE | Admit: 2022-05-05 | Discharge: 2022-05-05 | Disposition: A | Discharge status: Home / Self Care | Payer: Medicare Other | Source: Ambulatory Visit | Attending: Surgery | Admitting: Surgery

## 2022-05-05 DIAGNOSIS — N6320 Unspecified lump in the left breast, unspecified quadrant: Secondary | ICD-10-CM

## 2022-05-05 DIAGNOSIS — Z853 Personal history of malignant neoplasm of breast: Secondary | ICD-10-CM

## 2022-09-14 ENCOUNTER — Encounter (HOSPITAL_BASED_OUTPATIENT_CLINIC_OR_DEPARTMENT_OTHER): Payer: Self-pay | Admitting: Surgery

## 2022-09-14 DIAGNOSIS — L720 Epidermal cyst: Secondary | ICD-10-CM | POA: Diagnosis present

## 2022-09-14 NOTE — H&P (Signed)
PROVIDER: Adin Laker Myra Rude, MD  Chief Complaint: Follow-up (Left breast "knot")  History of Present Illness:  Patient returns to my practice with complaints of a new left breast mass. Patient has a personal history of right breast carcinoma. She underwent right mastectomy with TRAM flap reconstruction in 1995 by Dr. Francina Ames. Patient has had intermittent problems with left breast masses. She has undergone previous aspiration of cyst. She has had no sign of recurrent carcinoma or new disease in the left breast. Patient did undergo a mammogram on April 01, 2021. This showed no evidence of breast malignancy and follow-up screening mammogram was recommended in 1 year. Patient had noted a small cutaneous lesion on the inferior left breast. She had been seen by her primary care physician and had been using topical creams. She describes what is likely an epidermal inclusion cyst which has ruptured and drained. It is now resolving. She presents today for evaluation.   Review of Systems: A complete review of systems was obtained from the patient. I have reviewed this information and discussed as appropriate with the patient. See HPI as well for other ROS.  Review of Systems  Constitutional: Negative.  HENT: Negative.  Eyes: Negative.  Respiratory: Negative.  Cardiovascular: Negative.  Gastrointestinal: Negative.  Genitourinary: Negative.  Musculoskeletal: Positive for back pain.  Skin: Negative.  Neurological: Negative.  Endo/Heme/Allergies: Negative.  Psychiatric/Behavioral: Negative.    Medical History: Past Medical History:  Diagnosis Date  History of cancer  History of stroke   Patient Active Problem List  Diagnosis  Left breast mass  History of breast cancer in female  Epidermal inclusion cyst   Past Surgical History:  Procedure Laterality Date  CHOLECYSTECTOMY  HYSTERECTOMY  MASTECTOMY    Allergies  Allergen Reactions  Codeine Other (See Comments)   Morphine Other (See Comments)  hallucinations   Current Outpatient Medications on File Prior to Visit  Medication Sig Dispense Refill  acetaminophen (TYLENOL) 500 MG tablet Take 500 mg by mouth every 6 (six) hours as needed  aspirin 325 MG tablet Take by mouth  traMADoL (ULTRAM) 50 mg tablet TAKE ONE TABLET EVERY 6 HOURS AS NEEDED   No current facility-administered medications on file prior to visit.   Family History  Problem Relation Age of Onset  Breast cancer Mother    Social History   Tobacco Use  Smoking Status Former  Smokeless Tobacco Never    Social History   Socioeconomic History  Marital status: Single  Tobacco Use  Smoking status: Former  Smokeless tobacco: Never  Advertising account planner Use: Unknown  Substance and Sexual Activity  Alcohol use: Never  Drug use: Never   Objective:   There were no vitals filed for this visit.  There is no height or weight on file to calculate BMI.  Physical Exam   GENERAL APPEARANCE Comfortable, no acute issues Development: normal Gross deformities: none  SKIN Rash, lesions, ulcers: none Induration, erythema: none Nodules: none palpable  EYES Conjunctiva and lids: normal Pupils: equal and reactive  EARS, NOSE, MOUTH, THROAT External ears: no lesion or deformity External nose: no lesion or deformity Hearing: grossly normal  NECK Symmetric: yes Trachea: midline Thyroid: no palpable nodules in the thyroid bed  CHEST Respiratory effort: normal Retraction or accessory muscle use: no Breath sounds: normal bilaterally Rales, rhonchi, wheeze: none  CARDIOVASCULAR Auscultation: regular rhythm, normal rate Murmurs: none Pulses: radial pulse 2+ palpable Lower extremity edema: none  BREAST Right breast is surgically absent. TRAM reconstruction  is apparent. There is a good cosmetic result. Palpation reveals no nodular masses or densities on the right chest wall. Right axilla is free of adenopathy. Skin is  normal. Left breast shows normal nipple areolar complex. There is a small superficial violaceous area in the inferior central breast consistent with recent skin lesion. This appears to be resolving. There is no sign of infection. Remainder of the breast is soft, diffusely nodular, without discrete or dominant mass. There is no tenderness. There is no axillary adenopathy.  GENITOURINARY/RECTAL Not assessed  MUSCULOSKELETAL Station and gait: normal Digits and nails: no clubbing or cyanosis Muscle strength: grossly normal all extremities Range of motion: grossly normal all extremities Deformity: none  LYMPHATIC Cervical: none palpable Supraclavicular: none palpable  PSYCHIATRIC Oriented to person, place, and time: yes Mood and affect: normal for situation Judgment and insight: appropriate for situation   Assessment and Plan:   History of breast cancer in female Epidermal inclusion cyst  Patient presents today for follow-up of left breast mass. She has had no recurrence of the previous mass in the left breast which was likely cystic. She does have a small cutaneous lesion which may have been an epidermal inclusion cyst which is spontaneously drained. There is no palpable mass on examination today.  Patient will undergo screening mammogram of the left breast. We will order that study today.  Patient will monitor the area on the lower left breast. If the epidermal inclusion cyst decides to reform and become palpable, we will consider outpatient surgery for surgical excision. Otherwise, she will return for surgical care as needed.   Darnell Level, MD [facility] practice Office: [phone]

## 2022-09-15 ENCOUNTER — Encounter (HOSPITAL_BASED_OUTPATIENT_CLINIC_OR_DEPARTMENT_OTHER): Payer: Self-pay | Admitting: Surgery

## 2022-09-15 NOTE — Progress Notes (Signed)
Confirmed with Ivy at [facility] that patient is MAC with local. Not straight local. She will follow up on aspirin orders.

## 2022-09-21 NOTE — Anesthesia Preprocedure Evaluation (Signed)
Anesthesia Evaluation  Patient identified by MRN, date of birth, ID band Patient awake    Reviewed: Allergy & Precautions, NPO status , Patient's Chart, lab work & pertinent test results  Airway Mallampati: I  TM Distance: >3 FB Neck ROM: Full    Dental  (+) Edentulous Upper, Partial Lower   Pulmonary former smoker   Pulmonary exam normal breath sounds clear to auscultation       Cardiovascular negative cardio ROS Normal cardiovascular exam Rhythm:Regular Rate:Normal     Neuro/Psych  Headaches, Seizures - (as infant), Well Controlled,  CVA (1997), No Residual Symptoms  negative psych ROS   GI/Hepatic negative GI ROS, Neg liver ROS,,,  Endo/Other  negative endocrine ROS    Renal/GU negative Renal ROS  negative genitourinary   Musculoskeletal negative musculoskeletal ROS (+)    Abdominal   Peds  Hematology negative hematology ROS (+)   Anesthesia Other Findings   Reproductive/Obstetrics negative OB ROS                             Anesthesia Physical Anesthesia Plan  ASA: 2  Anesthesia Plan: MAC   Post-op Pain Management: Tylenol PO (pre-op)*   Induction:   PONV Risk Score and Plan: 2 and Ondansetron, Dexamethasone, Midazolam and Treatment may vary due to age or medical condition  Airway Management Planned: Natural Airway and Simple Face Mask  Additional Equipment: None  Intra-op Plan:   Post-operative Plan:   Informed Consent: I have reviewed the patients History and Physical, chart, labs and discussed the procedure including the risks, benefits and alternatives for the proposed anesthesia with the patient or authorized representative who has indicated his/her understanding and acceptance.       Plan Discussed with: CRNA  Anesthesia Plan Comments:        Anesthesia Quick Evaluation

## 2022-09-23 ENCOUNTER — Ambulatory Visit (HOSPITAL_BASED_OUTPATIENT_CLINIC_OR_DEPARTMENT_OTHER): Payer: Medicare Other | Admitting: Anesthesiology

## 2022-09-23 ENCOUNTER — Other Ambulatory Visit: Payer: Self-pay

## 2022-09-23 ENCOUNTER — Encounter (HOSPITAL_BASED_OUTPATIENT_CLINIC_OR_DEPARTMENT_OTHER)
Admission: RE | Disposition: A | Discharge status: Home / Self Care | Payer: Self-pay | Source: Home / Self Care | Attending: Surgery

## 2022-09-23 ENCOUNTER — Ambulatory Visit (HOSPITAL_BASED_OUTPATIENT_CLINIC_OR_DEPARTMENT_OTHER)
Admission: RE | Admit: 2022-09-23 | Discharge: 2022-09-23 | Disposition: A | Discharge status: Home / Self Care | Payer: Medicare Other | Attending: Surgery | Admitting: Surgery

## 2022-09-23 ENCOUNTER — Encounter (HOSPITAL_BASED_OUTPATIENT_CLINIC_OR_DEPARTMENT_OTHER): Payer: Self-pay | Admitting: Surgery

## 2022-09-23 DIAGNOSIS — Z853 Personal history of malignant neoplasm of breast: Secondary | ICD-10-CM | POA: Insufficient documentation

## 2022-09-23 DIAGNOSIS — Z01818 Encounter for other preprocedural examination: Secondary | ICD-10-CM

## 2022-09-23 DIAGNOSIS — N6082 Other benign mammary dysplasias of left breast: Secondary | ICD-10-CM | POA: Insufficient documentation

## 2022-09-23 DIAGNOSIS — Z8673 Personal history of transient ischemic attack (TIA), and cerebral infarction without residual deficits: Secondary | ICD-10-CM | POA: Diagnosis not present

## 2022-09-23 DIAGNOSIS — L720 Epidermal cyst: Secondary | ICD-10-CM

## 2022-09-23 DIAGNOSIS — Z87891 Personal history of nicotine dependence: Secondary | ICD-10-CM | POA: Insufficient documentation

## 2022-09-23 DIAGNOSIS — Z9011 Acquired absence of right breast and nipple: Secondary | ICD-10-CM | POA: Insufficient documentation

## 2022-09-23 DIAGNOSIS — Z803 Family history of malignant neoplasm of breast: Secondary | ICD-10-CM | POA: Insufficient documentation

## 2022-09-23 DIAGNOSIS — N6002 Solitary cyst of left breast: Secondary | ICD-10-CM

## 2022-09-23 DIAGNOSIS — Z90710 Acquired absence of both cervix and uterus: Secondary | ICD-10-CM | POA: Diagnosis not present

## 2022-09-23 HISTORY — PX: BREAST CYST EXCISION: SHX579

## 2022-09-23 SURGERY — EXCISION, CYST, BREAST
Anesthesia: LOCAL | Site: Breast | Laterality: Left

## 2022-09-23 SURGERY — EXCISION, CYST, BREAST
Anesthesia: Monitor Anesthesia Care | Site: Breast | Laterality: Left

## 2022-09-23 MED ORDER — FENTANYL CITRATE (PF) 100 MCG/2ML IJ SOLN
INTRAMUSCULAR | Status: AC
  Filled 2022-09-23: qty 2

## 2022-09-23 MED ORDER — OXYCODONE HCL 5 MG PO TABS: 5.0000 mg | ORAL_TABLET | Freq: Once | ORAL | Status: DC | PRN

## 2022-09-23 MED ORDER — ACETAMINOPHEN 500 MG PO TABS
ORAL_TABLET | ORAL | Status: AC
  Filled 2022-09-23: qty 2

## 2022-09-23 MED ORDER — OXYCODONE HCL 5 MG/5ML PO SOLN: 5.0000 mg | Freq: Once | ORAL | Status: DC | PRN

## 2022-09-23 MED ORDER — HYDROMORPHONE HCL 1 MG/ML IJ SOLN: 0.2500 mg | INTRAMUSCULAR | Status: DC | PRN

## 2022-09-23 MED ORDER — MIDAZOLAM HCL 2 MG/2ML IJ SOLN
INTRAMUSCULAR | Status: DC | PRN
  Administered 2022-09-23: 2 mg via INTRAVENOUS

## 2022-09-23 MED ORDER — ONDANSETRON HCL 4 MG/2ML IJ SOLN
INTRAMUSCULAR | Status: AC
  Filled 2022-09-23: qty 2

## 2022-09-23 MED ORDER — AMISULPRIDE (ANTIEMETIC) 5 MG/2ML IV SOLN: 10.0000 mg | Freq: Once | INTRAVENOUS | Status: DC | PRN

## 2022-09-23 MED ORDER — BUPIVACAINE HCL (PF) 0.25 % IJ SOLN
INTRAMUSCULAR | Status: DC | PRN
  Administered 2022-09-23: 9 mL

## 2022-09-23 MED ORDER — KETOROLAC TROMETHAMINE 30 MG/ML IJ SOLN: 30.0000 mg | Freq: Once | INTRAMUSCULAR | Status: DC | PRN

## 2022-09-23 MED ORDER — BUPIVACAINE-EPINEPHRINE (PF) 0.25% -1:200000 IJ SOLN
INTRAMUSCULAR | Status: AC
  Filled 2022-09-23: qty 30

## 2022-09-23 MED ORDER — ONDANSETRON HCL 4 MG/2ML IJ SOLN
INTRAMUSCULAR | Status: DC | PRN
  Administered 2022-09-23: 4 mg via INTRAVENOUS

## 2022-09-23 MED ORDER — ACETAMINOPHEN 500 MG PO TABS
1000.0000 mg | ORAL_TABLET | Freq: Once | ORAL | Status: AC
  Administered 2022-09-23: 1000 mg via ORAL

## 2022-09-23 MED ORDER — CEFAZOLIN SODIUM 1 G IJ SOLR
INTRAMUSCULAR | Status: AC
  Filled 2022-09-23: qty 20

## 2022-09-23 MED ORDER — FENTANYL CITRATE (PF) 100 MCG/2ML IJ SOLN
INTRAMUSCULAR | Status: DC | PRN
  Administered 2022-09-23: 50 ug via INTRAVENOUS

## 2022-09-23 MED ORDER — CEFAZOLIN SODIUM-DEXTROSE 2-3 GM-%(50ML) IV SOLR
INTRAVENOUS | Status: DC | PRN
  Administered 2022-09-23: 2 g via INTRAVENOUS

## 2022-09-23 MED ORDER — MIDAZOLAM HCL 2 MG/2ML IJ SOLN
INTRAMUSCULAR | Status: AC
  Filled 2022-09-23: qty 2

## 2022-09-23 MED ORDER — SODIUM CHLORIDE (PF) 0.9 % IJ SOLN
INTRAMUSCULAR | Status: AC
  Filled 2022-09-23: qty 20

## 2022-09-23 MED ORDER — PROPOFOL 500 MG/50ML IV EMUL
INTRAVENOUS | Status: DC | PRN
  Administered 2022-09-23: 150 ug/kg/min via INTRAVENOUS

## 2022-09-23 MED ORDER — LACTATED RINGERS IV SOLN
INTRAVENOUS | Status: DC
Start: 2022-09-23 — End: 2022-09-23

## 2022-09-23 SURGICAL SUPPLY — 37 items
APL PRP STRL LF DISP 70% ISPRP (MISCELLANEOUS) ×1
BLADE HEX COATED 2.75 (ELECTRODE) ×1 IMPLANT
BLADE SURG 15 STRL LF DISP TIS (BLADE) ×1 IMPLANT
BLADE SURG 15 STRL SS (BLADE) ×1
CANISTER SUCT 1200ML W/VALVE (MISCELLANEOUS) IMPLANT
CHLORAPREP W/TINT 26 (MISCELLANEOUS) ×1 IMPLANT
CLIP TI WIDE RED SMALL 6 (CLIP) IMPLANT
COVER BACK TABLE 60X90IN (DRAPES) ×1 IMPLANT
COVER MAYO STAND STRL (DRAPES) ×1 IMPLANT
DRAPE LAPAROTOMY 100X72 PEDS (DRAPES) ×1 IMPLANT
DRAPE UTILITY XL STRL (DRAPES) ×1 IMPLANT
ELECT REM PT RETURN 9FT ADLT (ELECTROSURGICAL) ×1
ELECTRODE REM PT RTRN 9FT ADLT (ELECTROSURGICAL) ×1 IMPLANT
GLOVE SURG ORTHO 8.0 STRL STRW (GLOVE) ×1 IMPLANT
GOWN STRL REUS W/ TWL LRG LVL3 (GOWN DISPOSABLE) ×1 IMPLANT
GOWN STRL REUS W/ TWL XL LVL3 (GOWN DISPOSABLE) ×1 IMPLANT
GOWN STRL REUS W/TWL LRG LVL3 (GOWN DISPOSABLE) ×1
GOWN STRL REUS W/TWL XL LVL3 (GOWN DISPOSABLE) ×1
KIT MARKER MARGIN INK (KITS) IMPLANT
NDL HYPO 25X1 1.5 SAFETY (NEEDLE) ×1 IMPLANT
NEEDLE HYPO 25X1 1.5 SAFETY (NEEDLE) ×1 IMPLANT
NS IRRIG 1000ML POUR BTL (IV SOLUTION) ×1 IMPLANT
PACK BASIN DAY SURGERY FS (CUSTOM PROCEDURE TRAY) ×1 IMPLANT
PENCIL SMOKE EVACUATOR (MISCELLANEOUS) ×1 IMPLANT
SLEEVE SCD COMPRESS KNEE MED (STOCKING) IMPLANT
SPIKE FLUID TRANSFER (MISCELLANEOUS) IMPLANT
SPONGE T-LAP 4X18 ~~LOC~~+RFID (SPONGE) ×1 IMPLANT
STRIP CLOSURE SKIN 1/2X4 (GAUZE/BANDAGES/DRESSINGS) ×1 IMPLANT
SUT MNCRL AB 4-0 PS2 18 (SUTURE) ×2 IMPLANT
SUT VIC AB 3-0 SH 27 (SUTURE) ×1
SUT VIC AB 3-0 SH 27X BRD (SUTURE) ×1 IMPLANT
SUT VIC AB 4-0 PS2 18 (SUTURE) IMPLANT
SYR CONTROL 10ML LL (SYRINGE) ×1 IMPLANT
TOWEL GREEN STERILE FF (TOWEL DISPOSABLE) ×2 IMPLANT
TRAY FAXITRON CT DISP (TRAY / TRAY PROCEDURE) IMPLANT
TUBE CONNECTING 20X1/4 (TUBING) IMPLANT
YANKAUER SUCT BULB TIP NO VENT (SUCTIONS) IMPLANT

## 2022-09-23 NOTE — Anesthesia Postprocedure Evaluation (Signed)
Anesthesia Post Note  Patient: Sheila Patterson  Procedure(s) Performed: EXCISION EPIDERMAL CYST LEFT BREAST (Left: Breast)     Patient location during evaluation: PACU Anesthesia Type: MAC Level of consciousness: awake and alert Pain management: pain level controlled Vital Signs Assessment: post-procedure vital signs reviewed and stable Respiratory status: spontaneous breathing, nonlabored ventilation and respiratory function stable Cardiovascular status: blood pressure returned to baseline and stable Postop Assessment: no apparent nausea or vomiting Anesthetic complications: no   No notable events documented.  Last Vitals:  Vitals:   09/23/22 1100 09/23/22 1115  BP: 137/66   Pulse: 62 61  Resp: 19 17  Temp:    SpO2: 97% 97%    Last Pain:  Vitals:   09/23/22 1115  TempSrc:   PainSc: 0-No pain                 Lannie Fields

## 2022-09-23 NOTE — Transfer of Care (Signed)
Immediate Anesthesia Transfer of Care Note  Patient: Sheila Patterson  Procedure(s) Performed: EXCISION EPIDERMAL CYST LEFT BREAST (Left: Breast)  Patient Location: PACU  Anesthesia Type:MAC  Level of Consciousness: awake, alert , and oriented  Airway & Oxygen Therapy: Patient Spontanous Breathing and Patient connected to face mask oxygen  Post-op Assessment: Report given to RN and Post -op Vital signs reviewed and stable  Post vital signs: Reviewed and stable  Last Vitals:  Vitals Value Taken Time  BP 132/60 (81)   Temp    Pulse 66 09/23/22 1056  Resp 13 09/23/22 1056  SpO2 100 % 09/23/22 1056  Vitals shown include unvalidated device data.  Last Pain:  Vitals:   09/23/22 0848  TempSrc: Temporal  PainSc: 4       Patients Stated Pain Goal: 2 (09/23/22 0848)  Complications: No notable events documented.

## 2022-09-23 NOTE — Op Note (Signed)
Operative Note  Pre-operative Diagnosis:  chronic left breast cyst  Post-operative Diagnosis:  same  Surgeon:  Darnell Level, MD  Assistant:  none   Procedure:  Excision chronic left breast cyst, 2.5 x 1.0 x 1.0 cm, skin and subcutaneous  Anesthesia:  local with IV sedation  Estimated Blood Loss:  minimal  Drains: none         Specimen: to pathology  Indications:  Patient returns to my practice with complaints of a new left breast mass. Patient has a personal history of right breast carcinoma. She underwent right mastectomy with TRAM flap reconstruction in 1995 by Dr. Francina Ames. Patient has had intermittent problems with left breast masses. She has undergone previous aspiration of cyst. She has had no sign of recurrent carcinoma or new disease in the left breast. Patient did undergo a mammogram on April 01, 2021. This showed no evidence of breast malignancy and follow-up screening mammogram was recommended in 1 year. Patient had noted a small cutaneous lesion on the inferior left breast. She had been seen by her primary care physician and had been using topical creams. She describes what is likely an epidermal inclusion cyst which has ruptured and drained.  It has recurred on at least 2 other occasions.  She now comes to surgery for definitive excision.  Procedure:  The patient was seen in the pre-op holding area. The risks, benefits, complications, treatment options, and expected outcomes were previously discussed with the patient. The patient agreed with the proposed plan and has signed the informed consent form.  The patient was brought to the operating room by the surgical team, identified as Sheila Patterson and the procedure verified. A "time out" was completed and the above information confirmed.  Following administration of intravenous sedation, the patient is positioned and then prepped and draped in the usual aseptic fashion.  Skin at the site of the lesion at the inframammary  crease of the left breast is anesthetized with local anesthetic.  Using a #15 blade, an elliptical incision was made so as to encompass the sinus tract opening to the skin.  Incision measures 2.5 cm in length by 1 cm in width.  It is carried into the subcutaneous tissues and hemostasis achieved with the electrocautery.  Underlying tissue is excised down to the chest wall with the electrocautery.  The entire specimen is excised.  It measures 2.5 x 1.0 x 1.0 cm and is submitted in its entirety to pathology for review.  Wound was irrigated with warm saline.  Good hemostasis is achieved with the electrocautery.  Subcutaneous tissues are closed with interrupted 3-0 Vicryl sutures.  Skin was closed with a running 4-0 Monocryl subcuticular suture.  Wound was washed and dried and Dermabond was applied as dressing.  Patient is awakened from anesthesia and transported to the recovery room.  The patient tolerated the procedure well.   Darnell Level, MD [facility] Surgery Office: [phone]

## 2022-09-23 NOTE — Interval H&P Note (Signed)
History and Physical Interval Note:  09/23/2022 10:09 AM  Sheila Patterson  has presented today for surgery, with the diagnosis of EPIDERMAL CYST LEFT BREAST.  The various methods of treatment have been discussed with the patient and family. After consideration of risks, benefits and other options for treatment, the patient has consented to    Procedure(s): EXCISION EPIDERMAL CYST LEFT BREAST (Left) as a surgical intervention.    The patient's history has been reviewed, patient examined, no change in status, stable for surgery.  I have reviewed the patient's chart and labs.  Questions were answered to the patient's satisfaction.    Darnell Level, MD [facility] practice Office: [phone]   Darnell Level

## 2022-09-23 NOTE — Discharge Instructions (Addendum)
  Post Anesthesia Home Care Instructions  Activity: Get plenty of rest for the remainder of the day. A responsible individual must stay with you for 24 hours following the procedure.  For the next 24 hours, DO NOT: -Drive a car -Advertising copywriter -Drink alcoholic beverages -Take any medication unless instructed by your physician -Make any legal decisions or sign important papers.  Meals: Start with liquid foods such as gelatin or soup. Progress to regular foods as tolerated. Avoid greasy, spicy, heavy foods. If nausea and/or vomiting occur, drink only clear liquids until the nausea and/or vomiting subsides. Call your physician if vomiting continues.  Special Instructions/Symptoms: Your throat may feel dry or sore from the anesthesia or the breathing tube placed in your throat during surgery. If this causes discomfort, gargle with warm salt water. The discomfort should disappear within 24 hours.       [facility] -- DISCHARGE INSTRUCTIONS  REMINDER:   Carry a list of your medications and allergies with you at all times  Call your pharmacy at least 1 week in advance to refill prescriptions  Do not mix any prescribed pain medicine with alcohol  Do not drive any motor vehicles while taking pain medication  Take medications with food unless otherwise directed  Follow-up appointments (date to return to physician): Please call [phone] to confirm your follow up appointment with your surgeon.  Call your Surgeon if you have:  Temperature greater than 101.0  Persistent nausea and vomiting  Severe uncontrolled pain  Redness, tenderness, or signs of infection (pain, swelling, redness, odor or Vadim Centola/yellow discharge around the site)  Difficulty breathing, headache or visual disturbances  Hives  Persistent dizziness or light-headedness  Any other questions or concerns you may have after discharge  In an emergency, call 911 or go to an Emergency Department at a nearby  hospital.  Diet: Begin with liquids, and if they are tolerated, resume your usual diet.  Avoid spicy, greasy or heavy foods.  If you have nausea or vomiting, go back to liquids.  If you cannot keep liquids down, call your doctor.  Avoid alcohol consumption while on prescription pain medications. Good nutrition promotes healing. Increase fiber and fluids.   ADDITIONAL INSTRUCTIONS: Leave Dermabond in place 7 to 10 days.  May shower.  Use ice pack for the first 2 days and then as needed.  [facility] Office: [phone]  Next dose of Tylenol can be taken today at 3pm.

## 2022-09-24 ENCOUNTER — Encounter (HOSPITAL_BASED_OUTPATIENT_CLINIC_OR_DEPARTMENT_OTHER): Payer: Self-pay | Admitting: Surgery

## 2022-09-24 LAB — SURGICAL PATHOLOGY

## 2022-09-25 NOTE — Progress Notes (Signed)
Pathology is benign, as expected.  tmg  Lorn Butcher, MD [facility] practice Office: [phone] 

## 2023-04-06 ENCOUNTER — Other Ambulatory Visit: Payer: Self-pay | Admitting: Surgery

## 2023-04-06 DIAGNOSIS — Z1231 Encounter for screening mammogram for malignant neoplasm of breast: Secondary | ICD-10-CM

## 2023-05-10 ENCOUNTER — Ambulatory Visit
Admission: RE | Admit: 2023-05-10 | Discharge: 2023-05-10 | Disposition: A | Discharge status: Home / Self Care | Payer: Medicare Other | Source: Ambulatory Visit | Attending: Surgery | Admitting: Surgery

## 2023-05-10 DIAGNOSIS — Z1231 Encounter for screening mammogram for malignant neoplasm of breast: Secondary | ICD-10-CM

## 2023-05-12 ENCOUNTER — Encounter: Payer: Self-pay | Admitting: Surgery

## 2023-05-12 NOTE — Progress Notes (Signed)
Mammogram report is good.  Surgical follow up as needed.  Darnell Level, MD [facility] practice Office: [phone]

## 2023-08-26 ENCOUNTER — Encounter: Payer: Self-pay | Admitting: Orthopaedic Surgery

## 2023-08-26 ENCOUNTER — Ambulatory Visit (INDEPENDENT_AMBULATORY_CARE_PROVIDER_SITE_OTHER): Admitting: Orthopaedic Surgery

## 2023-08-26 DIAGNOSIS — M65332 Trigger finger, left middle finger: Secondary | ICD-10-CM | POA: Diagnosis not present
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
## 2023-09-16 ENCOUNTER — Ambulatory Visit: Admitting: Orthopaedic Surgery

## 2023-11-26 ENCOUNTER — Encounter: Payer: Self-pay | Admitting: Radiology

## 2024-02-07 ENCOUNTER — Encounter: Payer: Self-pay | Admitting: Radiology

## 2024-04-14 ENCOUNTER — Other Ambulatory Visit: Payer: Self-pay | Admitting: Surgery

## 2024-04-14 DIAGNOSIS — Z1231 Encounter for screening mammogram for malignant neoplasm of breast: Secondary | ICD-10-CM

## 2024-05-10 ENCOUNTER — Ambulatory Visit
